# Patient Record
Sex: Female | Born: 1950 | Race: White | Hispanic: No | Marital: Married | Smoking: Never smoker
Health system: Southern US, Community
[De-identification: ages and names within clinical notes are randomized; demographics above are authoritative.]

## PROBLEM LIST (undated history)

## (undated) DIAGNOSIS — I10 Essential (primary) hypertension: Secondary | ICD-10-CM

## (undated) DIAGNOSIS — F419 Anxiety disorder, unspecified: Secondary | ICD-10-CM

## (undated) DIAGNOSIS — E7800 Pure hypercholesterolemia, unspecified: Secondary | ICD-10-CM

## (undated) DIAGNOSIS — I639 Cerebral infarction, unspecified: Secondary | ICD-10-CM

## (undated) DIAGNOSIS — K829 Disease of gallbladder, unspecified: Secondary | ICD-10-CM

## (undated) DIAGNOSIS — M1990 Unspecified osteoarthritis, unspecified site: Secondary | ICD-10-CM

## (undated) DIAGNOSIS — G4730 Sleep apnea, unspecified: Secondary | ICD-10-CM

## (undated) HISTORY — DX: Pure hypercholesterolemia, unspecified: E78.00

## (undated) HISTORY — DX: Cerebral infarction, unspecified: I63.9

## (undated) HISTORY — PX: OTHER SURGICAL HISTORY: SHX169

## (undated) HISTORY — DX: Essential (primary) hypertension: I10

## (undated) HISTORY — DX: Unspecified osteoarthritis, unspecified site: M19.90

## (undated) HISTORY — PX: APPENDECTOMY: SHX54

---

## 2000-01-12 ENCOUNTER — Other Ambulatory Visit: Admission: RE | Admit: 2000-01-12 | Discharge: 2000-01-12 | Payer: Self-pay | Admitting: Family Medicine

## 2003-07-23 ENCOUNTER — Ambulatory Visit (HOSPITAL_COMMUNITY): Admission: RE | Admit: 2003-07-23 | Discharge: 2003-07-23 | Payer: Self-pay | Admitting: *Deleted

## 2005-01-16 ENCOUNTER — Encounter: Admission: RE | Admit: 2005-01-16 | Discharge: 2005-01-16 | Payer: Self-pay | Admitting: Family Medicine

## 2005-07-31 ENCOUNTER — Encounter: Admission: RE | Admit: 2005-07-31 | Discharge: 2005-07-31 | Payer: Self-pay | Admitting: Family Medicine

## 2006-05-16 ENCOUNTER — Other Ambulatory Visit: Admission: RE | Admit: 2006-05-16 | Discharge: 2006-05-16 | Payer: Self-pay | Admitting: Family Medicine

## 2006-09-06 ENCOUNTER — Ambulatory Visit (HOSPITAL_COMMUNITY): Admission: RE | Admit: 2006-09-06 | Discharge: 2006-09-06 | Payer: Self-pay | Admitting: *Deleted

## 2007-08-20 ENCOUNTER — Ambulatory Visit (HOSPITAL_COMMUNITY): Admission: RE | Admit: 2007-08-20 | Discharge: 2007-08-20 | Payer: Self-pay | Admitting: Family Medicine

## 2009-05-27 ENCOUNTER — Ambulatory Visit (HOSPITAL_COMMUNITY): Admission: RE | Admit: 2009-05-27 | Discharge: 2009-05-27 | Payer: Self-pay | Admitting: Family Medicine

## 2011-02-15 ENCOUNTER — Ambulatory Visit (HOSPITAL_COMMUNITY)
Admission: RE | Admit: 2011-02-15 | Discharge: 2011-02-15 | Disposition: A | Discharge status: Home / Self Care | Payer: BC Managed Care – PPO | Source: Ambulatory Visit | Attending: Family Medicine | Admitting: Family Medicine

## 2011-02-15 ENCOUNTER — Other Ambulatory Visit (HOSPITAL_COMMUNITY): Payer: Self-pay | Admitting: Family Medicine

## 2011-02-15 DIAGNOSIS — Z01818 Encounter for other preprocedural examination: Secondary | ICD-10-CM | POA: Insufficient documentation

## 2011-02-15 DIAGNOSIS — I10 Essential (primary) hypertension: Secondary | ICD-10-CM | POA: Insufficient documentation

## 2011-02-15 DIAGNOSIS — E785 Hyperlipidemia, unspecified: Secondary | ICD-10-CM | POA: Insufficient documentation

## 2011-04-13 ENCOUNTER — Encounter (HOSPITAL_COMMUNITY)
Admission: RE | Admit: 2011-04-13 | Discharge: 2011-04-13 | Disposition: A | Discharge status: Home / Self Care | Payer: BC Managed Care – PPO | Source: Ambulatory Visit | Attending: Orthopedic Surgery | Admitting: Orthopedic Surgery

## 2011-04-13 LAB — DIFFERENTIAL
Basophils Relative: 1 % (ref 0–1)
Eosinophils Absolute: 0.1 10*3/uL (ref 0.0–0.7)
Eosinophils Relative: 2 % (ref 0–5)
Lymphs Abs: 1.6 10*3/uL (ref 0.7–4.0)
Monocytes Absolute: 0.6 10*3/uL (ref 0.1–1.0)
Monocytes Relative: 9 % (ref 3–12)
Neutrophils Relative %: 64 % (ref 43–77)

## 2011-04-13 LAB — CBC
MCH: 30.6 pg (ref 26.0–34.0)
MCHC: 34.9 g/dL (ref 30.0–36.0)
MCV: 87.5 fL (ref 78.0–100.0)
Platelets: 235 10*3/uL (ref 150–400)
RBC: 5.27 MIL/uL — ABNORMAL HIGH (ref 3.87–5.11)
RDW: 13 % (ref 11.5–15.5)

## 2011-04-13 LAB — COMPREHENSIVE METABOLIC PANEL
ALT: 38 U/L — ABNORMAL HIGH (ref 0–35)
AST: 28 U/L (ref 0–37)
Calcium: 9.4 mg/dL (ref 8.4–10.5)
GFR calc Af Amer: >60 mL/min (ref >60)
Glucose, Bld: 104 mg/dL — ABNORMAL HIGH (ref 70–99)
Sodium: 144 mEq/L (ref 135–145)
Total Protein: 7 g/dL (ref 6.0–8.3)

## 2011-04-13 LAB — PROTIME-INR: INR: 0.98 (ref 0.00–1.49)

## 2011-04-13 LAB — URINALYSIS, ROUTINE W REFLEX MICROSCOPIC
Bilirubin Urine: NEGATIVE
Ketones, ur: NEGATIVE mg/dL
Nitrite: NEGATIVE
Protein, ur: NEGATIVE mg/dL
Urobilinogen, UA: 0.2 mg/dL (ref 0.0–1.0)

## 2011-04-13 LAB — SURGICAL PCR SCREEN: Staphylococcus aureus: NEGATIVE

## 2011-04-14 LAB — URINE CULTURE: Culture  Setup Time: 201206011646

## 2011-04-20 ENCOUNTER — Inpatient Hospital Stay (HOSPITAL_COMMUNITY)
Admission: RE | Admit: 2011-04-20 | Discharge: 2011-04-23 | DRG: 209 | Disposition: A | Discharge status: Home Health | Payer: BC Managed Care – PPO | Source: Ambulatory Visit | Attending: Orthopedic Surgery | Admitting: Orthopedic Surgery

## 2011-04-20 ENCOUNTER — Inpatient Hospital Stay (HOSPITAL_COMMUNITY): Payer: BC Managed Care – PPO

## 2011-04-20 DIAGNOSIS — M1710 Unilateral primary osteoarthritis, unspecified knee: Principal | ICD-10-CM | POA: Diagnosis present

## 2011-04-20 DIAGNOSIS — Z01818 Encounter for other preprocedural examination: Secondary | ICD-10-CM

## 2011-04-20 DIAGNOSIS — E669 Obesity, unspecified: Secondary | ICD-10-CM | POA: Diagnosis present

## 2011-04-20 DIAGNOSIS — I10 Essential (primary) hypertension: Secondary | ICD-10-CM | POA: Diagnosis present

## 2011-04-20 DIAGNOSIS — E785 Hyperlipidemia, unspecified: Secondary | ICD-10-CM | POA: Diagnosis present

## 2011-04-20 LAB — ABO/RH: ABO/RH(D): O POS

## 2011-04-21 LAB — CBC
HCT: 36.4 % (ref 36.0–46.0)
Hemoglobin: 12.4 g/dL (ref 12.0–15.0)
MCHC: 34.1 g/dL (ref 30.0–36.0)
RBC: 4.12 MIL/uL (ref 3.87–5.11)

## 2011-04-22 LAB — CBC
HCT: 36.1 % (ref 36.0–46.0)
Hemoglobin: 12.6 g/dL (ref 12.0–15.0)
MCH: 30.7 pg (ref 26.0–34.0)
MCV: 87.8 fL (ref 78.0–100.0)
Platelets: 166 10*3/uL (ref 150–400)
RBC: 4.11 MIL/uL (ref 3.87–5.11)

## 2011-04-22 LAB — COMPREHENSIVE METABOLIC PANEL
BUN: 13 mg/dL (ref 6–23)
CO2: 28 mEq/L (ref 19–32)
Calcium: 8.7 mg/dL (ref 8.4–10.5)
Creatinine, Ser: 0.66 mg/dL (ref 0.4–1.2)
GFR calc Af Amer: >60 mL/min (ref >60)
GFR calc non Af Amer: >60 mL/min (ref >60)
Glucose, Bld: 142 mg/dL — ABNORMAL HIGH (ref 70–99)

## 2011-04-23 LAB — CBC
Hemoglobin: 12.6 g/dL (ref 12.0–15.0)
Platelets: 187 10*3/uL (ref 150–400)
RBC: 4.22 MIL/uL (ref 3.87–5.11)

## 2011-04-23 LAB — COMPREHENSIVE METABOLIC PANEL
ALT: 23 U/L (ref 0–35)
AST: 21 U/L (ref 0–37)
Alkaline Phosphatase: 43 U/L (ref 39–117)
CO2: 30 mEq/L (ref 19–32)
GFR calc Af Amer: >60 mL/min (ref >60)
GFR calc non Af Amer: >60 mL/min (ref >60)
Glucose, Bld: 121 mg/dL — ABNORMAL HIGH (ref 70–99)
Potassium: 3.5 mEq/L (ref 3.5–5.1)
Sodium: 139 mEq/L (ref 135–145)
Total Protein: 6.3 g/dL (ref 6.0–8.3)

## 2011-04-30 NOTE — Op Note (Signed)
  NAME:  Heidi Rhodes, Heidi Rhodes NO.:  [id number]  MEDICAL RECORD NO.:  [id number]  LOCATION:  5037                         FACILITY:  [facility]  PHYSICIAN:  Dyke Brackett, M.D.    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  PREOPERATIVE DIAGNOSIS:  Osteoarthritis, left knee.  POSTOPERATIVE DIAGNOSIS:  Osteoarthritis, left knee.  OPERATION:  Sigma rotating bearing cemented knee (size 4 femur, tibia 12.5-mm, 3 peg all-poly patella with revision tray).  SURGEON:  Dyke Brackett, MD  ASSISTANT:  Oris Drone. Petrarca, PA-C  TOURNIQUET TIME:  1 hour 7 minutes.  PROCEDURE IN DETAILS:  Straight skin incision was made.  Medial parapatellar approach to the knee made.  We did moderate stripping on the medial side of the knee for a varus knee, removed osteophytes, resected ACL, PCL, cut the distal femur 5 degree valgus, cut at the left knee, followed by a neutral cut on the tibia, resecting about 2 mm below the most diseased medial compartment.  We then checked the extension gap which was 12.5 mm eventually matching the flexion gap at 12.5.  We sized the femur to be a 4, followed by placement of the alignment jig to set rotation with pins.  Then did the anterior, posterior and chamfer cuts. Again checked the flexion gap at 12-5.  Attention was next directed at the tibia.  Excess menisci were removed. Complete release of the PCL, sized the tibia to be 4 and then placed the reamer and then drilled for revision tray in view of the patient's large BMI.  Trial was placed on the tibia and the femur and we cut the patella after measuring the patella leaving about 17 mm of native patella for a 38-mm patella.  Trial reduction was carried out with full  extension.  No significant instability of varus valgus stress testing and good stability of the bearing.  Final components were inserted to tibia followed by femur patella.  We placed a trial tibial bearing of the cement hardened.  Excess cement was removed.  We removed the trial bearing, checked the back of the knee for excess cement, none was noted.  We then released the tourniquet.  No excessive bleeding was noted from the back of the knee.  We placed the final bearing and the knee and again checked all parameters which were deemed to be acceptable.  Closure was affected with #1 Ethibond, 2-0 Vicryl, and skin clips.  Capsule was infiltrated with Marcaine.  Hemovac drain placed exiting superolaterally.  Lightly compressive sterile dressing, knee immobilizer applied.  Taken to recovery room in stable condition.     Dyke Brackett, M.D.     WDC/MEDQ  D:  04/20/2011  T:  04/21/2011  Job:  [id number]  Electronically Signed by W. Ancelmo Hunt M.D. on 04/30/2011 01:06:51 PM

## 2011-12-04 ENCOUNTER — Other Ambulatory Visit (HOSPITAL_COMMUNITY): Payer: Self-pay | Admitting: Internal Medicine

## 2011-12-04 ENCOUNTER — Other Ambulatory Visit (HOSPITAL_COMMUNITY): Payer: Self-pay | Admitting: Family Medicine

## 2011-12-04 DIAGNOSIS — K589 Irritable bowel syndrome without diarrhea: Secondary | ICD-10-CM

## 2011-12-04 DIAGNOSIS — Z139 Encounter for screening, unspecified: Secondary | ICD-10-CM

## 2011-12-04 DIAGNOSIS — R109 Unspecified abdominal pain: Secondary | ICD-10-CM

## 2011-12-05 ENCOUNTER — Encounter (INDEPENDENT_AMBULATORY_CARE_PROVIDER_SITE_OTHER): Payer: Self-pay | Admitting: *Deleted

## 2011-12-11 ENCOUNTER — Ambulatory Visit (HOSPITAL_COMMUNITY)
Admission: RE | Admit: 2011-12-11 | Discharge: 2011-12-11 | Disposition: A | Discharge status: Home / Self Care | Payer: BC Managed Care – PPO | Source: Ambulatory Visit | Attending: Family Medicine | Admitting: Family Medicine

## 2011-12-11 ENCOUNTER — Ambulatory Visit (HOSPITAL_COMMUNITY)
Admission: RE | Admit: 2011-12-11 | Discharge: 2011-12-11 | Disposition: A | Discharge status: Home / Self Care | Payer: BC Managed Care – PPO | Source: Ambulatory Visit | Attending: Internal Medicine | Admitting: Internal Medicine

## 2011-12-11 ENCOUNTER — Other Ambulatory Visit (HOSPITAL_COMMUNITY): Payer: Self-pay | Admitting: Family Medicine

## 2011-12-11 DIAGNOSIS — K589 Irritable bowel syndrome without diarrhea: Secondary | ICD-10-CM

## 2011-12-11 DIAGNOSIS — R109 Unspecified abdominal pain: Secondary | ICD-10-CM

## 2011-12-11 DIAGNOSIS — Z139 Encounter for screening, unspecified: Secondary | ICD-10-CM

## 2011-12-11 DIAGNOSIS — K8020 Calculus of gallbladder without cholecystitis without obstruction: Secondary | ICD-10-CM | POA: Insufficient documentation

## 2011-12-11 DIAGNOSIS — Z1231 Encounter for screening mammogram for malignant neoplasm of breast: Secondary | ICD-10-CM | POA: Insufficient documentation

## 2011-12-18 ENCOUNTER — Encounter (INDEPENDENT_AMBULATORY_CARE_PROVIDER_SITE_OTHER): Payer: Self-pay | Admitting: Internal Medicine

## 2011-12-18 ENCOUNTER — Telehealth (INDEPENDENT_AMBULATORY_CARE_PROVIDER_SITE_OTHER): Payer: Self-pay | Admitting: *Deleted

## 2011-12-18 ENCOUNTER — Other Ambulatory Visit (INDEPENDENT_AMBULATORY_CARE_PROVIDER_SITE_OTHER): Payer: Self-pay | Admitting: *Deleted

## 2011-12-18 ENCOUNTER — Ambulatory Visit (INDEPENDENT_AMBULATORY_CARE_PROVIDER_SITE_OTHER): Payer: BC Managed Care – PPO | Admitting: Internal Medicine

## 2011-12-18 ENCOUNTER — Encounter (INDEPENDENT_AMBULATORY_CARE_PROVIDER_SITE_OTHER): Payer: Self-pay | Admitting: *Deleted

## 2011-12-18 VITALS — BP 128/82 | HR 68 | Temp 98.9°F | Ht 66.0 in | Wt 257.8 lb

## 2011-12-18 DIAGNOSIS — R109 Unspecified abdominal pain: Secondary | ICD-10-CM | POA: Insufficient documentation

## 2011-12-18 DIAGNOSIS — I10 Essential (primary) hypertension: Secondary | ICD-10-CM | POA: Insufficient documentation

## 2011-12-18 DIAGNOSIS — Z1211 Encounter for screening for malignant neoplasm of colon: Secondary | ICD-10-CM

## 2011-12-18 DIAGNOSIS — R1030 Lower abdominal pain, unspecified: Secondary | ICD-10-CM

## 2011-12-18 DIAGNOSIS — M1990 Unspecified osteoarthritis, unspecified site: Secondary | ICD-10-CM | POA: Insufficient documentation

## 2011-12-18 DIAGNOSIS — M129 Arthropathy, unspecified: Secondary | ICD-10-CM

## 2011-12-18 MED ORDER — HYOSCYAMINE SULFATE 0.125 MG SL SUBL: 0.1250 mg | SUBLINGUAL_TABLET | SUBLINGUAL | Status: DC | PRN

## 2011-12-18 MED ORDER — CIPROFLOXACIN IN D5W 400 MG/200ML IV SOLN: 400.0000 mg | Freq: Once | INTRAVENOUS | Status: DC

## 2011-12-18 NOTE — Telephone Encounter (Signed)
Patient needs half lytely 

## 2011-12-18 NOTE — Patient Instructions (Signed)
Levsin 0.125mg  sl as needed for abdominal pain. HIDA scan

## 2011-12-18 NOTE — Progress Notes (Signed)
Subjective:     Patient ID: Heidi Rhodes, female   DOB: June 06, 1951, 61 y.o.   MRN: [id number]  HPI  Heidi Rhodes is a 61 yr old female referred to our by Dr. Phillips Odor at [facility] for abdominal.  She tells me she has to be careful what she eats. She avoids raw vegetables.  If she does eat them, she will have diarrhea.  She says that she drank a half cup of coffee and then had urgency. Her symptoms have been going on for years. She says her symptoms have gradually gotten worse. Having a BM relieves her symptoms.  She is having some acid reflux.   She says she has lower abdominal pain after eating which she describes as cramping.  She takes Dicyclomine about 30 minutes before she eats.  She says this has helped some.  Appetite is good. No weight loss.  BM x 1-2 a day.  Stools are usually formed. No melena or bright red rectal bleeding.   Last colonoscopy was in 2003 thru [facility].and was normal.  12/04/2011 Abdominal US: IMPRESSION: Cholelithiasis.No ultrasonic evidence of acute  cholecystitis. Liver upper normal size with increased echogenicity  of the hepatic parenchyma. Most commonly this is associated with  fatty infiltration of the liver but is not specific for it.  Original Report Authenticated By: Crawford Givens, M.D.    Review of Systems Current Outpatient Prescriptions  Medication Sig Dispense Refill  . amLODipine (NORVASC) 5 MG tablet Take 5 mg by mouth daily.      . Ascorbic Acid (VITAMIN C) 100 MG tablet Take 100 mg by mouth daily.      Marland Kitchen dicyclomine (BENTYL) 10 MG capsule Take 10 mg by mouth as needed.      . hydrochlorothiazide (HYDRODIURIL) 25 MG tablet Take 25 mg by mouth daily.      . Multiple Vitamin (MULITIVITAMIN) LIQD Take 5 mLs by mouth daily.       Past Medical History  Diagnosis Date  . HTN (hypertension)   . Arthritis    Past Surgical History  Procedure Date  . Left knee replacement     2012   Family Status  Relation Status Death Age  . Mother Alive     good  health  . Father Deceased     MI age 79 or 29  . Brother Other     One deceased from [facility], one as arthritis. One in good health.   History   Social History  . Marital Status: Married    Spouse Name: N/A    Number of Children: N/A  . Years of Education: N/A   Occupational History  . Not on file.   Social History Main Topics  . Smoking status: Never Smoker   . Smokeless tobacco: Not on file  . Alcohol Use: No  . Drug Use: No  . Sexually Active: Not on file   Other Topics Concern  . Not on file   Social History Narrative  . No narrative on file   Allergies no known allergies     Objective:   Physical Exam  Filed Vitals:   12/18/11 0946  Height: 5\' 6"  (1.676 m)  Weight: 257 lb 12.8 oz (116.937 kg)    Alert and oriented. Skin warm and dry. Oral mucosa is moist.   . Sclera anicteric, conjunctivae is pink. Thyroid not enlarged. No cervical lymphadenopathy. Lungs clear. Heart regular rate and rhythm.  Abdomen is soft. Bowel sounds are positive. No hepatomegaly. No  abdominal masses felt. No tenderness.  No edema to lower extremities. Patient is alert and oriented.      Assessment:   Lower abdominal pain which appears to Irritable bowel syndrome. Symptoms for years.  Her last colonoscopy was about 10 yrs ago.      Plan:   Colonoscopy in the near future with Dr. Karilyn Cota

## 2011-12-21 MED ORDER — BISACODYL-PEG-KCL-NABICAR-NACL 5-210 MG-GM PO KIT: 1.0000 | PACK | Freq: Once | ORAL | Status: DC

## 2012-02-11 ENCOUNTER — Encounter (HOSPITAL_COMMUNITY): Payer: Self-pay | Admitting: Pharmacy Technician

## 2012-02-13 ENCOUNTER — Encounter (HOSPITAL_COMMUNITY)
Admission: RE | Disposition: A | Discharge status: Home / Self Care | Payer: Self-pay | Source: Ambulatory Visit | Attending: Internal Medicine

## 2012-02-13 ENCOUNTER — Encounter (HOSPITAL_COMMUNITY): Payer: Self-pay | Admitting: *Deleted

## 2012-02-13 ENCOUNTER — Ambulatory Visit (HOSPITAL_COMMUNITY)
Admission: RE | Admit: 2012-02-13 | Discharge: 2012-02-13 | Disposition: A | Discharge status: Home / Self Care | Payer: BC Managed Care – PPO | Source: Ambulatory Visit | Attending: Internal Medicine | Admitting: Internal Medicine

## 2012-02-13 DIAGNOSIS — Z96659 Presence of unspecified artificial knee joint: Secondary | ICD-10-CM | POA: Insufficient documentation

## 2012-02-13 DIAGNOSIS — K5730 Diverticulosis of large intestine without perforation or abscess without bleeding: Secondary | ICD-10-CM

## 2012-02-13 DIAGNOSIS — Z1211 Encounter for screening for malignant neoplasm of colon: Secondary | ICD-10-CM

## 2012-02-13 DIAGNOSIS — I10 Essential (primary) hypertension: Secondary | ICD-10-CM | POA: Insufficient documentation

## 2012-02-13 DIAGNOSIS — K644 Residual hemorrhoidal skin tags: Secondary | ICD-10-CM | POA: Insufficient documentation

## 2012-02-13 DIAGNOSIS — R1030 Lower abdominal pain, unspecified: Secondary | ICD-10-CM

## 2012-02-13 DIAGNOSIS — R109 Unspecified abdominal pain: Secondary | ICD-10-CM | POA: Insufficient documentation

## 2012-02-13 DIAGNOSIS — M129 Arthropathy, unspecified: Secondary | ICD-10-CM | POA: Insufficient documentation

## 2012-02-13 DIAGNOSIS — Z79899 Other long term (current) drug therapy: Secondary | ICD-10-CM | POA: Insufficient documentation

## 2012-02-13 HISTORY — PX: COLONOSCOPY: SHX5424

## 2012-02-13 SURGERY — COLONOSCOPY
Anesthesia: Moderate Sedation

## 2012-02-13 MED ORDER — MIDAZOLAM HCL 5 MG/5ML IJ SOLN
INTRAMUSCULAR | Status: DC | PRN
  Administered 2012-02-13 (×3): 2 mg via INTRAVENOUS

## 2012-02-13 MED ORDER — STERILE WATER FOR IRRIGATION IR SOLN
Status: DC | PRN
  Administered 2012-02-13: 10:00:00

## 2012-02-13 MED ORDER — SODIUM CHLORIDE 0.9 % IV SOLN
2.0000 g | Freq: Once | INTRAVENOUS | Status: AC
  Administered 2012-02-13: 2 g via INTRAVENOUS

## 2012-02-13 MED ORDER — MIDAZOLAM HCL 5 MG/5ML IJ SOLN
INTRAMUSCULAR | Status: AC
  Filled 2012-02-13: qty 10

## 2012-02-13 MED ORDER — CIPROFLOXACIN IN D5W 400 MG/200ML IV SOLN
INTRAVENOUS | Status: AC
  Filled 2012-02-13: qty 200

## 2012-02-13 MED ORDER — SODIUM CHLORIDE 0.45 % IV SOLN
Freq: Once | INTRAVENOUS | Status: AC
  Administered 2012-02-13: 1000 mL via INTRAVENOUS

## 2012-02-13 MED ORDER — MEPERIDINE HCL 50 MG/ML IJ SOLN
INTRAMUSCULAR | Status: DC | PRN
  Administered 2012-02-13 (×2): 25 mg via INTRAVENOUS

## 2012-02-13 MED ORDER — MEPERIDINE HCL 50 MG/ML IJ SOLN
INTRAMUSCULAR | Status: AC
  Filled 2012-02-13: qty 1

## 2012-02-13 MED ORDER — SODIUM CHLORIDE 0.9 % IV SOLN
INTRAVENOUS | Status: AC
  Filled 2012-02-13: qty 2000

## 2012-02-13 MED ORDER — CIPROFLOXACIN IN D5W 400 MG/200ML IV SOLN
400.0000 mg | Freq: Once | INTRAVENOUS | Status: AC
  Administered 2012-02-13: 400 mg via INTRAVENOUS

## 2012-02-13 NOTE — H&P (Signed)
Heidi Rhodes is an 61 y.o. female.   Chief Complaint: Patient is for colonoscopy. HPI: Patient is 61 year old Caucasian female who is her average risk screening colonoscopy. Her last exam was 10 years ago in [name], [place] and was normal. She has intermittent postprandial abdominal cramps and diarrhea and has responded to Levsin that she uses on an as-needed basis. She denies rectal bleeding. Family history is negative for colorectal carcinoma. Patient has been given ampicillin and Cipro IV for antimicrobial prophylaxis since she had left knee replacement last year.  Past Medical History  Diagnosis Date  . HTN (hypertension)   . Arthritis     Past Surgical History  Procedure Date  . Left knee replacement     2012  . Appendectomy   . Ceasarean section     Family History  Problem Relation Age of Onset  . Heart attack Father    Social History:  reports that she has never smoked. She does not have any smokeless tobacco history on file. She reports that she does not drink alcohol or use illicit drugs.  Allergies: No Known Allergies  Medications Prior to Admission  Medication Dose Route Frequency Provider Last Rate Last Dose  . 0.45 % sodium chloride infusion   Intravenous Once Malissa Hippo, MD 20 mL/hr at 02/13/12 0838 1,000 mL at 02/13/12 0838  . ampicillin (OMNIPEN) 2 g in sodium chloride 0.9 % 50 mL IVPB  2 g Intravenous Once Malissa Hippo, MD   2 g at 02/13/12 0839  . ciprofloxacin (CIPRO) 400 MG/200ML IVPB           . ciprofloxacin (CIPRO) IVPB 400 mg  400 mg Intravenous Once Malissa Hippo, MD   400 mg at 02/13/12 0849  . meperidine (DEMEROL) 50 MG/ML injection           . midazolam (VERSED) 5 MG/5ML injection           . simethicone susp in sterile water 1000 mL irrigation    PRN Malissa Hippo, MD      . sodium chloride 0.9 % with ampicillin (OMNIPEN) ADS Med            Medications Prior to Admission  Medication Sig Dispense Refill  .  Bisacodyl-PEG-KCl-NaBicar-NaCl (HALFLYTELY WITH FLAVOR PACKS) 5-210 MG-GM kit Take 1 Package by mouth once.  1 each  0    No results found for this or any previous visit (from the past 48 hour(s)). No results found.  Review of Systems  Constitutional: Negative for weight loss.  Gastrointestinal: Negative for constipation, blood in stool and melena.    Blood pressure 149/73, pulse 82, temperature 97.9 F (36.6 C), temperature source Oral, resp. rate 18, height 5\' 7"  (1.702 m), weight 267 lb (121.11 kg), SpO2 98.00%. Physical Exam  Constitutional: She appears well-developed and well-nourished.  HENT:  Mouth/Throat: Oropharynx is clear and moist.  Eyes: Conjunctivae are normal. No scleral icterus.  Neck: No thyromegaly present.  Cardiovascular: Normal rate, regular rhythm and normal heart sounds.   No murmur heard. Respiratory: Effort normal and breath sounds normal.  GI: Soft. She exhibits no distension. There is no tenderness.  Musculoskeletal: She exhibits no edema.  Lymphadenopathy:    She has no cervical adenopathy.  Neurological: She is alert.  Skin: Skin is warm.     Assessment/Plan Average risk screening colonoscopy.  Kayliegh Boyers U 02/13/2012, 9:49 AM

## 2012-02-13 NOTE — Op Note (Signed)
COLONOSCOPY PROCEDURE REPORT  PATIENT:  Heidi Rhodes  MR#:  [id number] Birthdate:  03-21-1951, 61 y.o., female Endoscopist:  Dr. Malissa Hippo, MD Referred By:  Dr. Colette Ribas, MD Procedure Date: 02/13/2012  Procedure:   Colonoscopy  Indications:  Patient is 61 year old Caucasian female who is undergoing average risk screening colonoscopy.  Informed Consent:  The procedure and risks were reviewed with the patient and informed consent was obtained.  Medications:  Demerol 50 mg IV Versed 6 mg IV  Description of procedure:  After a digital rectal exam was performed, that colonoscope was advanced from the anus through the rectum and colon to the area of the cecum, ileocecal valve and appendiceal orifice. The cecum was deeply intubated. These structures were well-seen and photographed for the record. From the level of the cecum and ileocecal valve, the scope was slowly and cautiously withdrawn. The mucosal surfaces were carefully surveyed utilizing scope tip to flexion to facilitate fold flattening as needed. The scope was pulled down into the rectum where a thorough exam including retroflexion was performed.  Findings:   Prep at right colon was marginal and satisfactory distal to the hepatic flexure. Cecal landmarks well as seen after vigorous washing. Normal mucosa throughout. Few diverticula at the sigmoid colon. Small hemorrhoids below the dentate line and anal papillae.  Therapeutic/Diagnostic Maneuvers Performed:  None  Complications:  None  Cecal Withdrawal Time:  12 minutes  Impression:  Examination performed to cecum. Mild sigmoid colon diverticulosis. Small external hemorrhoids and anal papillae.  Recommendations:  Standard instructions given. Next screening exam in 10 years.  Heidi Rhodes U  02/13/2012 10:23 AM  CC: Dr. Colette Ribas, MD, MD & Dr. Bonnetta Barry ref. provider found

## 2012-02-13 NOTE — Discharge Instructions (Signed)
Resume usual medications and high fiber diet. No driving for 16-XWRUE. Next screening exam in 10 years. Do not take dicyclomine while on hyoscyamine or Levsin SL. Colonoscopy Care After Read the instructions outlined below and refer to this sheet in the next few weeks. These discharge instructions provide you with general information on caring for yourself after you leave the hospital. Your doctor may also give you specific instructions. While your treatment has been planned according to the most current medical practices available, unavoidable complications occasionally occur. If you have any problems or questions after discharge, call your doctor. HOME CARE INSTRUCTIONS ACTIVITY:  You may resume your regular activity, but move at a slower pace for the next 24 hours.   Take frequent rest periods for the next 24 hours.   Walking will help get rid of the air and reduce the bloated feeling in your belly (abdomen).   No driving for 24 hours (because of the medicine (anesthesia) used during the test).   You may shower.   Do not sign any important legal documents or operate any machinery for 24 hours (because of the anesthesia used during the test).  NUTRITION:  Drink plenty of fluids.   You may resume your normal diet as instructed by your doctor.   Begin with a light meal and progress to your normal diet. Heavy or fried foods are harder to digest and may make you feel sick to your stomach (nauseated).   Avoid alcoholic beverages for 24 hours or as instructed.  MEDICATIONS:  You may resume your normal medications unless your doctor tells you otherwise.  WHAT TO EXPECT TODAY:  Some feelings of bloating in the abdomen.   Passage of more gas than usual.   Spotting of blood in your stool or on the toilet paper.  IF YOU HAD POLYPS REMOVED DURING THE COLONOSCOPY:  No aspirin products for 7 days or as instructed.   No alcohol for 7 days or as instructed.   Eat a soft diet for the  next 24 hours.  FINDING OUT THE RESULTS OF YOUR TEST Not all test results are available during your visit. If your test results are not back during the visit, make an appointment with your caregiver to find out the results. Do not assume everything is normal if you have not heard from your caregiver or the medical facility. It is important for you to follow up on all of your test results.  SEEK IMMEDIATE MEDICAL CARE IF:  You have more than a spotting of blood in your stool.   Your belly is swollen (abdominal distention).   You are nauseated or vomiting.   You have a fever.   You have abdominal pain or discomfort that is severe or gets worse throughout the day.  Document Released: 06/12/2004 Document Revised: 10/18/2011 Document Reviewed: 06/10/2008 [facility] Patient Information 2012 [place], [place].Hemorrhoids Hemorrhoids are enlarged (dilated) veins around the rectum. There are 2 types of hemorrhoids, and the type of hemorrhoid is determined by its location. Internal hemorrhoids occur in the veins just inside the rectum.They are usually not painful, but they may bleed.However, they may poke through to the outside and become irritated and painful. External hemorrhoids involve the veins outside the anus and can be felt as a painful swelling or hard lump near the anus.They are often itchy and may crack and bleed. Sometimes clots will form in the veins. This makes them swollen and painful. These are called thrombosed hemorrhoids. CAUSES Causes of hemorrhoids include:  Pregnancy.  This increases the pressure in the hemorrhoidal veins.   Constipation.   Straining to have a bowel movement.   Obesity.   Heavy lifting or other activity that caused you to strain.  TREATMENT Most of the time hemorrhoids improve in 1 to 2 weeks. However, if symptoms do not seem to be getting better or if you have a lot of rectal bleeding, your caregiver may perform a procedure to help make the hemorrhoids get  smaller or remove them completely.Possible treatments include:  Rubber band ligation. A rubber band is placed at the base of the hemorrhoid to cut off the circulation.   Sclerotherapy. A chemical is injected to shrink the hemorrhoid.   Infrared light therapy. Tools are used to burn the hemorrhoid.   Hemorrhoidectomy. This is surgical removal of the hemorrhoid.  HOME CARE INSTRUCTIONS   Increase fiber in your diet. Ask your caregiver about using fiber supplements.   Drink enough water and fluids to keep your urine clear or pale yellow.   Exercise regularly.   Go to the bathroom when you have the urge to have a bowel movement. Do not wait.   Avoid straining to have bowel movements.   Keep the anal area dry and clean.   Only take over-the-counter or prescription medicines for pain, discomfort, or fever as directed by your caregiver.  If your hemorrhoids are thrombosed:  Take warm sitz baths for 20 to 30 minutes, 3 to 4 times per day.   If the hemorrhoids are very tender and swollen, place ice packs on the area as tolerated. Using ice packs between sitz baths may be helpful. Fill a plastic bag with ice. Place a towel between the bag of ice and your skin.   Medicated creams and suppositories may be used or applied as directed.   Do not use a donut-shaped pillow or sit on the toilet for long periods. This increases blood pooling and pain.  SEEK MEDICAL CARE IF:   You have increasing pain and swelling that is not controlled with your medicine.   You have uncontrolled bleeding.   You have difficulty or you are unable to have a bowel movement.   You have pain or inflammation outside the area of the hemorrhoids.   You have chills or an oral temperature above 102 F (38.9 C).  MAKE SURE YOU:   Understand these instructions.   Will watch your condition.   Will get help right away if you are not doing well or get worse.  Document Released: 10/26/2000 Document Revised:  10/18/2011 Document Reviewed: 03/02/2008 [facility] Patient Information 2012 [place], [place].Diverticulosis Diverticulosis is a common condition that develops when small pouches (diverticula) form in the wall of the colon. The risk of diverticulosis increases with age. It happens more often in people who eat a low-fiber diet. Most individuals with diverticulosis have no symptoms. Those individuals with symptoms usually experience abdominal pain, constipation, or loose stools (diarrhea). HOME CARE INSTRUCTIONS   Increase the amount of fiber in your diet as directed by your caregiver or dietician. This may reduce symptoms of diverticulosis.   Your caregiver may recommend taking a dietary fiber supplement.   Drink at least 6 to 8 glasses of water each day to prevent constipation.   Try not to strain when you have a bowel movement.   Your caregiver may recommend avoiding nuts and seeds to prevent complications, although this is still an uncertain benefit.   Only take over-the-counter or prescription medicines for pain, discomfort, or fever  as directed by your caregiver.  FOODS WITH HIGH FIBER CONTENT INCLUDE:  Fruits. Apple, peach, pear, tangerine, raisins, prunes.   Vegetables. Brussels sprouts, asparagus, broccoli, cabbage, carrot, cauliflower, romaine lettuce, spinach, summer squash, tomato, winter squash, zucchini.   Starchy Vegetables. Baked beans, kidney beans, lima beans, split peas, lentils, potatoes (with skin).   Grains. Whole wheat bread, brown rice, bran flake cereal, plain oatmeal, white rice, shredded wheat, bran muffins.  SEEK IMMEDIATE MEDICAL CARE IF:   You develop increasing pain or severe bloating.   You have an oral temperature above 102 F (38.9 C), not controlled by medicine.   You develop vomiting or bowel movements that are bloody or black.  Document Released: 07/26/2004 Document Revised: 10/18/2011 Document Reviewed: 03/29/2010 [facility] Patient Information 2012  [place], [place].

## 2012-02-15 ENCOUNTER — Encounter (HOSPITAL_COMMUNITY): Payer: Self-pay

## 2012-02-15 ENCOUNTER — Encounter (HOSPITAL_COMMUNITY)
Admission: RE | Admit: 2012-02-15 | Discharge: 2012-02-15 | Disposition: A | Discharge status: Home / Self Care | Payer: BC Managed Care – PPO | Source: Ambulatory Visit | Attending: Internal Medicine | Admitting: Internal Medicine

## 2012-02-15 ENCOUNTER — Other Ambulatory Visit (INDEPENDENT_AMBULATORY_CARE_PROVIDER_SITE_OTHER): Payer: Self-pay | Admitting: Internal Medicine

## 2012-02-15 DIAGNOSIS — R109 Unspecified abdominal pain: Secondary | ICD-10-CM | POA: Insufficient documentation

## 2012-02-15 DIAGNOSIS — R932 Abnormal findings on diagnostic imaging of liver and biliary tract: Secondary | ICD-10-CM | POA: Insufficient documentation

## 2012-02-15 DIAGNOSIS — M1990 Unspecified osteoarthritis, unspecified site: Secondary | ICD-10-CM

## 2012-02-15 MED ORDER — MORPHINE SULFATE 2 MG/ML IJ SOLN: 2.0000 mg | Freq: Once | INTRAMUSCULAR | Status: DC

## 2012-02-15 MED ORDER — TECHNETIUM TC 99M MEBROFENIN IV KIT
5.0000 | PACK | Freq: Once | INTRAVENOUS | Status: AC | PRN
  Administered 2012-02-15: 5.5 via INTRAVENOUS

## 2012-02-15 MED ORDER — MORPHINE SULFATE 2 MG/ML IJ SOLN
INTRAMUSCULAR | Status: AC
  Filled 2012-02-15: qty 1

## 2012-02-15 NOTE — Progress Notes (Signed)
Morphine 2mg /mL     1mL injected per MD order thru right arm hep lock followed by 10mL 0.9% NS    Tolerated well

## 2012-02-20 ENCOUNTER — Telehealth (INDEPENDENT_AMBULATORY_CARE_PROVIDER_SITE_OTHER): Payer: Self-pay | Admitting: Internal Medicine

## 2012-02-20 NOTE — Telephone Encounter (Signed)
Spoke with patient concerning her abnormal HIDA scan.  Needs a surgical referral to Dr. Malvin Johns.   Ann, Surgical referral to Dr. Malvin Johns for abnormal HIDA scan.

## 2012-02-21 NOTE — Telephone Encounter (Signed)
appt w/ Dr Malvin Johns 02/27/12 @ 11:00, lmom advising patient of appt, notes faxed

## 2012-03-11 ENCOUNTER — Encounter (INDEPENDENT_AMBULATORY_CARE_PROVIDER_SITE_OTHER): Payer: Self-pay

## 2012-03-21 ENCOUNTER — Other Ambulatory Visit (INDEPENDENT_AMBULATORY_CARE_PROVIDER_SITE_OTHER): Payer: Self-pay | Admitting: Internal Medicine

## 2012-03-31 ENCOUNTER — Other Ambulatory Visit: Payer: Self-pay | Admitting: Physician Assistant

## 2012-04-01 ENCOUNTER — Encounter (HOSPITAL_COMMUNITY): Payer: Self-pay

## 2012-04-01 ENCOUNTER — Inpatient Hospital Stay (HOSPITAL_COMMUNITY): Admission: RE | Admit: 2012-04-01 | Discharge: 2012-04-01 | Payer: BC Managed Care – PPO | Source: Ambulatory Visit

## 2012-04-01 HISTORY — DX: Disease of gallbladder, unspecified: K82.9

## 2012-04-01 NOTE — Pre-Procedure Instructions (Signed)
[address] [place]  04/01/2012   Your procedure is scheduled on:  Friday, June 7  Report to [facility] at 9:00 AM.  Call this number if you have problems the morning of surgery: [phone]   Remember:   Do not eat food:After Midnight.  May have clear liquids: up to 4 Hours before arrival.  Clear liquids include soda, tea, black coffee, apple or grape juice, broth.  Take these medicines the morning of surgery with A SIP OF WATER: Amlodipine   Do not wear jewelry, make-up or nail polish.  Do not wear lotions, powders, or perfumes. You may wear deodorant.  Do not shave 48 hours prior to surgery. Men may shave face and neck.  Do not bring valuables to the hospital.  Contacts, dentures or bridgework may not be worn into surgery.  Leave suitcase in the car. After surgery it may be brought to your room.  For patients admitted to the hospital, checkout time is 11:00 AM the day of discharge.   Patients discharged the day of surgery will not be allowed to drive home.  Name and phone number of your driver: NA  Special Instructions: Incentive Spirometry - Practice and bring it with you on the day of surgery. and CHG Shower Use Special Wash: 1/2 bottle night before surgery and 1/2 bottle morning of surgery.   Please read over the following fact sheets that you were given: Pain Booklet, Coughing and Deep Breathing, Blood Transfusion Information, Total Joint Packet and Surgical Site Infection Prevention

## 2012-04-04 ENCOUNTER — Encounter (HOSPITAL_COMMUNITY): Payer: Self-pay | Admitting: Pharmacy Technician

## 2012-04-11 ENCOUNTER — Inpatient Hospital Stay (HOSPITAL_COMMUNITY): Admission: RE | Admit: 2012-04-11 | Payer: BC Managed Care – PPO | Source: Ambulatory Visit

## 2012-04-11 ENCOUNTER — Ambulatory Visit (HOSPITAL_COMMUNITY)
Admission: RE | Admit: 2012-04-11 | Discharge: 2012-04-11 | Disposition: A | Discharge status: Home / Self Care | Payer: BC Managed Care – PPO | Source: Ambulatory Visit | Attending: Orthopedic Surgery | Admitting: Orthopedic Surgery

## 2012-04-11 ENCOUNTER — Encounter (HOSPITAL_COMMUNITY)
Admission: RE | Admit: 2012-04-11 | Discharge: 2012-04-11 | Disposition: A | Discharge status: Home / Self Care | Payer: BC Managed Care – PPO | Source: Ambulatory Visit | Attending: Orthopedic Surgery | Admitting: Orthopedic Surgery

## 2012-04-11 ENCOUNTER — Other Ambulatory Visit (HOSPITAL_COMMUNITY): Payer: BC Managed Care – PPO

## 2012-04-11 ENCOUNTER — Encounter (HOSPITAL_COMMUNITY): Payer: Self-pay

## 2012-04-11 DIAGNOSIS — Z01818 Encounter for other preprocedural examination: Secondary | ICD-10-CM | POA: Insufficient documentation

## 2012-04-11 HISTORY — DX: Anxiety disorder, unspecified: F41.9

## 2012-04-11 LAB — URINALYSIS, ROUTINE W REFLEX MICROSCOPIC
Hgb urine dipstick: NEGATIVE
Ketones, ur: NEGATIVE mg/dL
Protein, ur: NEGATIVE mg/dL
Urobilinogen, UA: 0.2 mg/dL (ref 0.0–1.0)

## 2012-04-11 LAB — DIFFERENTIAL
Basophils Absolute: 0 10*3/uL (ref 0.0–0.1)
Lymphocytes Relative: 22 % (ref 12–46)
Monocytes Absolute: 0.7 10*3/uL (ref 0.1–1.0)
Neutro Abs: 4.7 10*3/uL (ref 1.7–7.7)
Neutrophils Relative %: 66 % (ref 43–77)

## 2012-04-11 LAB — CBC
MCH: 29.6 pg (ref 26.0–34.0)
MCV: 89 fL (ref 78.0–100.0)
Platelets: 216 10*3/uL (ref 150–400)
RDW: 13.4 % (ref 11.5–15.5)

## 2012-04-11 LAB — PROTIME-INR: INR: 0.99 (ref 0.00–1.49)

## 2012-04-11 LAB — COMPREHENSIVE METABOLIC PANEL
AST: 38 U/L — ABNORMAL HIGH (ref 0–37)
Albumin: 4.1 g/dL (ref 3.5–5.2)
CO2: 27 mEq/L (ref 19–32)
Calcium: 9.7 mg/dL (ref 8.4–10.5)
Creatinine, Ser: 0.6 mg/dL (ref 0.50–1.10)
GFR calc non Af Amer: >90 mL/min (ref >90)

## 2012-04-11 LAB — URINE MICROSCOPIC-ADD ON

## 2012-04-11 LAB — SURGICAL PCR SCREEN
MRSA, PCR: NEGATIVE
Staphylococcus aureus: NEGATIVE

## 2012-04-11 LAB — TYPE AND SCREEN
ABO/RH(D): O POS
Antibody Screen: NEGATIVE

## 2012-04-13 LAB — URINE CULTURE

## 2012-04-14 NOTE — Consult Note (Signed)
Anesthesia Chart Review:  Patient is a 61 year old female scheduled for right TKA on 04/18/12.  History includes non-smoker, obesity with BMI 41.8, HTN, anxiety, arthritis, gall bladder disease (scheduled for lap chole on 05/05/12 according to Epic), prior left TKA 04/2011.  PCP is Dr. Phillips Odor.  She was medically cleared prior to her last TKA.  Labs noted.  AST/ALT 38/43.  CBC, coags WNL.    CXR from 04/11/12 showed:  Stable mild cardiomegaly. No active lung disease.   EKG from 531/13 showed NSR, poor anterior R wave progression.  Overall, I think it is stable when compared to her prior EKG from 02/15/11 (Dr. Phillips Odor).    Anticipate she can proceed if no significant change in her status.  Shonna Chock, PA-C 04/14/12

## 2012-04-16 NOTE — H&P (Signed)
NAME: Heidi Rhodes MRN: #[id number] DATE: April 14, 2012 DOB: [birth year]  COMPLAINT:    Follow up right knee osteoarthritis.  HISTORY OF PRESENT ILLNESS:     The patient is a 61 year old female with a history of end-stage osteoarthritis of the right knee, also with hypertension and history of gallbladder problems.  She recently had a left total knee arthroplasty by Dr. Madelon Lips on 04/20/11 from which she is doing very well.  Today she is here to discuss her right knee osteoarthritis and continued knee pain.  She has failed conservative treatments with oral NSAIDs, corticosteroid injections, gait aids, and physical therapy.  The right knee pain is significantly affecting her quality of life and activities of daily living.  It will occasionally awaken her from sleep at night.  CURRENT MEDICATIONS:     Norvasc 6 mg daily and HCTZ 25 mg daily.  She is not currently taking any blood thinners.  ALLERGIES:     NKDA.  PAST MEDICAL HISTORY:     Hypertension.  The patient states she has gallbladder problems with possible planned cholecystectomy at the end of June.  PAST SURGICAL HISTORY:     Childbirth x 4 and appendectomy.  ROS:    Ten point review of systems is obtained.  It is positive for glasses/contacts, high blood pressure, some ankle swelling, and gallstones. Please refer to patient information form for details.  FAMILY HISTORY:    Positive for heart disease in mother and father, heart attack in mother and father, diabetes in father, and high blood pressure in mother, father, brother, and child.  SOCIAL HISTORY:    Nonsmoker.  Nondrinker.  She is married and lives with her husband in a one-story residence.  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    EXAM:     The patient is seated in the examination room in no acute distress.  She is alert, oriented, and appears appropriate age.  HT:  5'8"   WT:  275 pounds  BMI:  41.8  TEMP:  97.7 degrees  BP:  156/87   P:  77     R:   18  Skin:   No rashes, no lesions. HEENT:   PERRLA.  She has no dentures or oral implants.  Her neck is supple with good range of motion.    Chest:   Normal breath sounds, clear to auscultation bilaterally     Heart:   Regular rate and rhythm.  No signs of murmurs.   Abdomen:   Soft, non tender.  Active bowel sounds in all four quadrants. Rectal/Breast:    Not indicated for surgery. Muscles:     No tenderness, no muscle atrophy. Neuro:   Cranial nerves grossly intact. Musculoskeletal:    Examination of the right lower extremity shows she is neurovascularly intact.  He has tenderness to palpation along the medial joint line.  Positive patellofemoral crepitus.  Stable ligamentous testing.  She has no calf tenderness to palpation.  Dorsiflexion and plantar flexion of the ankle intact.  X-RAYS:     No images were obtained today.  X-rays taken of the right knee on 02/15/12 show severe osteoarthritis with bone-on-bone medial compartment.  IMPRESSION:      1. Right knee osteoarthritis, end stage. 2. Hypertension. 3. History of gallbladder problems (?).  RECOMMENDATIONS:     The risks and benefits of right total knee arthroplasty were discussed again today.  The patient swishes to proceed with the surgery.  She has a  preadmission appointment [facility] at [facility]. Surgery is scheduled for 04/18/12.  She does wish to go home following the surgery.  She currently has home health arranged through [place].  She was not required to obtain preoperative clearance as she had just done this for the left total knee replacement recently.  Her primary care physician is Dr. Assunta Found in [place].  She was given preoperative instructions.  We also discussed postoperative deep vein thrombosis prophylaxis and perioperative antibiotics.  She will be given Ancef and Lovenox following surgery.  If she has any further questions or concerns prior to surgery, she may contact our office.  Josh Rosezella Kronick  P.A.-C/[id number]  Auto-Authenticated by Estanislado Spire P.A.-C

## 2012-04-17 MED ORDER — ACETAMINOPHEN 10 MG/ML IV SOLN
1000.0000 mg | Freq: Four times a day (QID) | INTRAVENOUS | Status: DC
  Filled 2012-04-17 (×4): qty 100

## 2012-04-17 MED ORDER — SODIUM CHLORIDE 0.9 % IV SOLN: INTRAVENOUS | Status: DC

## 2012-04-17 MED ORDER — CHLORHEXIDINE GLUCONATE 4 % EX LIQD: 60.0000 mL | Freq: Once | CUTANEOUS | Status: DC

## 2012-04-17 MED ORDER — CEFAZOLIN SODIUM-DEXTROSE 2-3 GM-% IV SOLR
2.0000 g | INTRAVENOUS | Status: AC
  Administered 2012-04-18: 2 g via INTRAVENOUS
  Filled 2012-04-17: qty 50

## 2012-04-17 NOTE — Progress Notes (Signed)
PT CALLED AT HOME .Marland KitchenREGARDING TIME CHANGE... SHE WILL ARRIVE AT 0830 AM.

## 2012-04-18 ENCOUNTER — Inpatient Hospital Stay (HOSPITAL_COMMUNITY)
Admission: RE | Admit: 2012-04-18 | Discharge: 2012-04-21 | DRG: 209 | Disposition: A | Discharge status: Home Health | Payer: BC Managed Care – PPO | Source: Ambulatory Visit | Attending: Orthopedic Surgery | Admitting: Orthopedic Surgery

## 2012-04-18 ENCOUNTER — Ambulatory Visit (HOSPITAL_COMMUNITY): Payer: BC Managed Care – PPO | Admitting: Vascular Surgery

## 2012-04-18 ENCOUNTER — Encounter (HOSPITAL_COMMUNITY): Payer: Self-pay | Admitting: Vascular Surgery

## 2012-04-18 ENCOUNTER — Encounter (HOSPITAL_COMMUNITY)
Admission: RE | Disposition: A | Discharge status: Home / Self Care | Payer: Self-pay | Source: Ambulatory Visit | Attending: Orthopedic Surgery

## 2012-04-18 ENCOUNTER — Encounter (HOSPITAL_COMMUNITY): Payer: Self-pay | Admitting: *Deleted

## 2012-04-18 ENCOUNTER — Inpatient Hospital Stay (HOSPITAL_COMMUNITY): Payer: BC Managed Care – PPO

## 2012-04-18 DIAGNOSIS — M1710 Unilateral primary osteoarthritis, unspecified knee: Principal | ICD-10-CM | POA: Diagnosis present

## 2012-04-18 DIAGNOSIS — F411 Generalized anxiety disorder: Secondary | ICD-10-CM | POA: Diagnosis present

## 2012-04-18 DIAGNOSIS — Z9089 Acquired absence of other organs: Secondary | ICD-10-CM

## 2012-04-18 DIAGNOSIS — Z8249 Family history of ischemic heart disease and other diseases of the circulatory system: Secondary | ICD-10-CM

## 2012-04-18 DIAGNOSIS — I10 Essential (primary) hypertension: Secondary | ICD-10-CM | POA: Diagnosis present

## 2012-04-18 DIAGNOSIS — K829 Disease of gallbladder, unspecified: Secondary | ICD-10-CM | POA: Diagnosis present

## 2012-04-18 HISTORY — PX: TOTAL KNEE ARTHROPLASTY: SHX125

## 2012-04-18 SURGERY — ARTHROPLASTY, KNEE, TOTAL
Anesthesia: Regional | Site: Knee | Laterality: Right | Wound class: Clean

## 2012-04-18 MED ORDER — METHOCARBAMOL 100 MG/ML IJ SOLN
500.0000 mg | Freq: Four times a day (QID) | INTRAVENOUS | Status: DC | PRN
  Administered 2012-04-18: 500 mg via INTRAVENOUS
  Filled 2012-04-18: qty 5

## 2012-04-18 MED ORDER — ACETAMINOPHEN 650 MG RE SUPP: 650.0000 mg | Freq: Four times a day (QID) | RECTAL | Status: DC | PRN

## 2012-04-18 MED ORDER — HYOSCYAMINE SULFATE 0.125 MG SL SUBL
0.1250 mg | SUBLINGUAL_TABLET | SUBLINGUAL | Status: DC | PRN
  Filled 2012-04-18: qty 1

## 2012-04-18 MED ORDER — ENOXAPARIN SODIUM 30 MG/0.3ML ~~LOC~~ SOLN
30.0000 mg | Freq: Two times a day (BID) | SUBCUTANEOUS | Status: DC
  Administered 2012-04-19 – 2012-04-21 (×5): 30 mg via SUBCUTANEOUS
  Filled 2012-04-18 (×7): qty 0.3

## 2012-04-18 MED ORDER — HYDROMORPHONE HCL PF 1 MG/ML IJ SOLN: 1.0000 mg | INTRAMUSCULAR | Status: DC | PRN

## 2012-04-18 MED ORDER — FLEET ENEMA 7-19 GM/118ML RE ENEM: 1.0000 | ENEMA | Freq: Once | RECTAL | Status: AC | PRN

## 2012-04-18 MED ORDER — LIDOCAINE HCL 4 % MT SOLN
OROMUCOSAL | Status: DC | PRN
  Administered 2012-04-18: 4 mL via TOPICAL

## 2012-04-18 MED ORDER — PHENOL 1.4 % MT LIQD: 1.0000 | OROMUCOSAL | Status: DC | PRN

## 2012-04-18 MED ORDER — MIDAZOLAM HCL 2 MG/2ML IJ SOLN
INTRAMUSCULAR | Status: AC
  Filled 2012-04-18: qty 2

## 2012-04-18 MED ORDER — HYDROCHLOROTHIAZIDE 25 MG PO TABS
25.0000 mg | ORAL_TABLET | Freq: Every day | ORAL | Status: DC
Start: 2012-04-19 — End: 2012-04-21
  Administered 2012-04-19 – 2012-04-21 (×3): 25 mg via ORAL
  Filled 2012-04-18 (×3): qty 1

## 2012-04-18 MED ORDER — SODIUM CHLORIDE 0.9 % IV SOLN
INTRAVENOUS | Status: DC
  Administered 2012-04-19 (×2): via INTRAVENOUS

## 2012-04-18 MED ORDER — DOCUSATE SODIUM 100 MG PO CAPS
100.0000 mg | ORAL_CAPSULE | Freq: Two times a day (BID) | ORAL | Status: DC
  Administered 2012-04-18 – 2012-04-21 (×5): 100 mg via ORAL
  Filled 2012-04-18 (×8): qty 1

## 2012-04-18 MED ORDER — HYDRALAZINE HCL 20 MG/ML IJ SOLN
INTRAMUSCULAR | Status: DC | PRN
  Administered 2012-04-18: 10 mg via INTRAVENOUS
  Administered 2012-04-18 (×2): 5 mg via INTRAVENOUS

## 2012-04-18 MED ORDER — ACETAMINOPHEN 10 MG/ML IV SOLN
INTRAVENOUS | Status: AC
  Filled 2012-04-18: qty 100

## 2012-04-18 MED ORDER — NEOSTIGMINE METHYLSULFATE 1 MG/ML IJ SOLN
INTRAMUSCULAR | Status: DC | PRN
  Administered 2012-04-18: 4 mg via INTRAVENOUS

## 2012-04-18 MED ORDER — LIDOCAINE HCL (CARDIAC) 20 MG/ML IV SOLN
INTRAVENOUS | Status: DC | PRN
  Administered 2012-04-18: 50 mg via INTRAVENOUS

## 2012-04-18 MED ORDER — FENTANYL CITRATE 0.05 MG/ML IJ SOLN
INTRAMUSCULAR | Status: AC
  Filled 2012-04-18: qty 2

## 2012-04-18 MED ORDER — DEXTROSE 5 % IV SOLN
INTRAVENOUS | Status: DC | PRN
  Administered 2012-04-18: 11:00:00 via INTRAVENOUS

## 2012-04-18 MED ORDER — AMLODIPINE BESYLATE 5 MG PO TABS
5.0000 mg | ORAL_TABLET | Freq: Every day | ORAL | Status: DC
  Administered 2012-04-19 – 2012-04-21 (×3): 5 mg via ORAL
  Filled 2012-04-18 (×3): qty 1

## 2012-04-18 MED ORDER — DEXAMETHASONE SODIUM PHOSPHATE 4 MG/ML IJ SOLN
INTRAMUSCULAR | Status: DC | PRN
  Administered 2012-04-18: 8 mg via INTRAVENOUS

## 2012-04-18 MED ORDER — HYDROMORPHONE HCL PF 1 MG/ML IJ SOLN
INTRAMUSCULAR | Status: AC
  Filled 2012-04-18: qty 1

## 2012-04-18 MED ORDER — LACTATED RINGERS IV SOLN
INTRAVENOUS | Status: DC
  Administered 2012-04-18: 10:00:00 via INTRAVENOUS

## 2012-04-18 MED ORDER — ONDANSETRON HCL 4 MG PO TABS: 4.0000 mg | ORAL_TABLET | Freq: Four times a day (QID) | ORAL | Status: DC | PRN

## 2012-04-18 MED ORDER — SENNOSIDES-DOCUSATE SODIUM 8.6-50 MG PO TABS
1.0000 | ORAL_TABLET | Freq: Every evening | ORAL | Status: DC | PRN
  Administered 2012-04-21: 1 via ORAL
  Filled 2012-04-18: qty 1

## 2012-04-18 MED ORDER — FENTANYL CITRATE 0.05 MG/ML IJ SOLN
INTRAMUSCULAR | Status: DC | PRN
  Administered 2012-04-18 (×4): 50 ug via INTRAVENOUS
  Administered 2012-04-18: 100 ug via INTRAVENOUS
  Administered 2012-04-18: 150 ug via INTRAVENOUS
  Administered 2012-04-18: 50 ug via INTRAVENOUS

## 2012-04-18 MED ORDER — MENTHOL 3 MG MT LOZG: 1.0000 | LOZENGE | OROMUCOSAL | Status: DC | PRN

## 2012-04-18 MED ORDER — PROPOFOL 10 MG/ML IV EMUL
INTRAVENOUS | Status: DC | PRN
  Administered 2012-04-18: 180 mg via INTRAVENOUS
  Administered 2012-04-18: 20 mg via INTRAVENOUS

## 2012-04-18 MED ORDER — CEFAZOLIN SODIUM-DEXTROSE 2-3 GM-% IV SOLR
2.0000 g | Freq: Four times a day (QID) | INTRAVENOUS | Status: AC
  Administered 2012-04-18 (×2): 2 g via INTRAVENOUS
  Filled 2012-04-18 (×2): qty 50

## 2012-04-18 MED ORDER — MIDAZOLAM HCL 2 MG/2ML IJ SOLN
1.0000 mg | INTRAMUSCULAR | Status: DC | PRN
  Administered 2012-04-18: 1.5 mg via INTRAVENOUS
  Administered 2012-04-18: 0.5 mg via INTRAVENOUS

## 2012-04-18 MED ORDER — ACETAMINOPHEN 325 MG PO TABS
650.0000 mg | ORAL_TABLET | Freq: Four times a day (QID) | ORAL | Status: DC | PRN
  Administered 2012-04-20 – 2012-04-21 (×2): 650 mg via ORAL
  Filled 2012-04-18: qty 2

## 2012-04-18 MED ORDER — HYDROMORPHONE HCL PF 1 MG/ML IJ SOLN
0.2500 mg | INTRAMUSCULAR | Status: DC | PRN
  Administered 2012-04-18 (×4): 0.5 mg via INTRAVENOUS

## 2012-04-18 MED ORDER — ACETAMINOPHEN 10 MG/ML IV SOLN
1000.0000 mg | Freq: Four times a day (QID) | INTRAVENOUS | Status: AC
  Administered 2012-04-18 – 2012-04-19 (×4): 1000 mg via INTRAVENOUS
  Filled 2012-04-18 (×3): qty 100

## 2012-04-18 MED ORDER — LACTATED RINGERS IV SOLN
INTRAVENOUS | Status: DC | PRN
  Administered 2012-04-18 (×3): via INTRAVENOUS

## 2012-04-18 MED ORDER — SUCCINYLCHOLINE CHLORIDE 20 MG/ML IJ SOLN
INTRAMUSCULAR | Status: DC | PRN
  Administered 2012-04-18: 120 mg via INTRAVENOUS

## 2012-04-18 MED ORDER — ROCURONIUM BROMIDE 100 MG/10ML IV SOLN
INTRAVENOUS | Status: DC | PRN
  Administered 2012-04-18: 20 mg via INTRAVENOUS
  Administered 2012-04-18: 30 mg via INTRAVENOUS

## 2012-04-18 MED ORDER — GLYCOPYRROLATE 0.2 MG/ML IJ SOLN
INTRAMUSCULAR | Status: DC | PRN
Start: 2012-04-18 — End: 2012-04-18
  Administered 2012-04-18: .5 mg via INTRAVENOUS

## 2012-04-18 MED ORDER — METHOCARBAMOL 500 MG PO TABS
500.0000 mg | ORAL_TABLET | Freq: Four times a day (QID) | ORAL | Status: DC | PRN
  Administered 2012-04-18 – 2012-04-21 (×8): 500 mg via ORAL
  Filled 2012-04-18 (×10): qty 1

## 2012-04-18 MED ORDER — SODIUM CHLORIDE 0.9 % IR SOLN
Status: DC | PRN
  Administered 2012-04-18: 3000 mL

## 2012-04-18 MED ORDER — ZOLPIDEM TARTRATE 5 MG PO TABS: 5.0000 mg | ORAL_TABLET | Freq: Every evening | ORAL | Status: DC | PRN

## 2012-04-18 MED ORDER — ONDANSETRON HCL 4 MG/2ML IJ SOLN
INTRAMUSCULAR | Status: DC | PRN
  Administered 2012-04-18: 4 mg via INTRAVENOUS

## 2012-04-18 MED ORDER — MIDAZOLAM HCL 5 MG/5ML IJ SOLN
INTRAMUSCULAR | Status: DC | PRN
  Administered 2012-04-18: 2 mg via INTRAVENOUS

## 2012-04-18 MED ORDER — FENTANYL CITRATE 0.05 MG/ML IJ SOLN
50.0000 ug | INTRAMUSCULAR | Status: DC | PRN
  Administered 2012-04-18: 75 ug via INTRAVENOUS
  Administered 2012-04-18: 25 ug via INTRAVENOUS

## 2012-04-18 MED ORDER — DIPHENHYDRAMINE HCL 12.5 MG/5ML PO ELIX: 12.5000 mg | ORAL_SOLUTION | ORAL | Status: DC | PRN

## 2012-04-18 MED ORDER — METOCLOPRAMIDE HCL 10 MG PO TABS: 5.0000 mg | ORAL_TABLET | Freq: Three times a day (TID) | ORAL | Status: DC | PRN

## 2012-04-18 MED ORDER — OXYCODONE HCL 5 MG PO TABS
5.0000 mg | ORAL_TABLET | ORAL | Status: DC | PRN
  Administered 2012-04-18 – 2012-04-21 (×15): 10 mg via ORAL
  Filled 2012-04-18 (×15): qty 2

## 2012-04-18 MED ORDER — ONDANSETRON HCL 4 MG/2ML IJ SOLN: 4.0000 mg | Freq: Four times a day (QID) | INTRAMUSCULAR | Status: DC | PRN

## 2012-04-18 SURGICAL SUPPLY — 64 items
BANDAGE ESMARK 6X9 LF (GAUZE/BANDAGES/DRESSINGS) ×1 IMPLANT
BLADE SAGITTAL 25.0X1.19X90 (BLADE) ×2 IMPLANT
BLADE SAW SAG 90X13X1.27 (BLADE) ×2 IMPLANT
BNDG CMPR 9X6 STRL LF SNTH (GAUZE/BANDAGES/DRESSINGS) ×1
BNDG ESMARK 6X9 LF (GAUZE/BANDAGES/DRESSINGS) ×2
BOWL SMART MIX CTS (DISPOSABLE) ×2 IMPLANT
CEMENT HV SMART SET (Cement) ×3 IMPLANT
CLOTH BEACON ORANGE TIMEOUT ST (SAFETY) ×2 IMPLANT
COMP FEM CEM RT SZ4 (Orthopedic Implant) ×2 IMPLANT
COMPONENT FEM CEM RT SZ4 (Orthopedic Implant) IMPLANT
COVER BACK TABLE 24X17X13 BIG (DRAPES) IMPLANT
COVER SURGICAL LIGHT HANDLE (MISCELLANEOUS) ×2 IMPLANT
CUFF TOURNIQUET SINGLE 34IN LL (TOURNIQUET CUFF) ×2 IMPLANT
CUFF TOURNIQUET SINGLE 44IN (TOURNIQUET CUFF) IMPLANT
DRAPE INCISE IOBAN 66X45 STRL (DRAPES) IMPLANT
DRAPE ORTHO SPLIT 77X108 STRL (DRAPES) ×4
DRAPE SURG ORHT 6 SPLT 77X108 (DRAPES) ×2 IMPLANT
DRAPE U-SHAPE 47X51 STRL (DRAPES) ×2 IMPLANT
DRSG ADAPTIC 3X8 NADH LF (GAUZE/BANDAGES/DRESSINGS) ×2 IMPLANT
DRSG PAD ABDOMINAL 8X10 ST (GAUZE/BANDAGES/DRESSINGS) ×2 IMPLANT
DURAPREP 26ML APPLICATOR (WOUND CARE) ×2 IMPLANT
ELECT REM PT RETURN 9FT ADLT (ELECTROSURGICAL) ×2
ELECTRODE REM PT RTRN 9FT ADLT (ELECTROSURGICAL) ×1 IMPLANT
EVACUATOR 1/8 PVC DRAIN (DRAIN) ×2 IMPLANT
FACESHIELD LNG OPTICON STERILE (SAFETY) ×4 IMPLANT
FLOSEAL 10ML (HEMOSTASIS) IMPLANT
GLOVE BIOGEL PI IND STRL 8 (GLOVE) ×4 IMPLANT
GLOVE BIOGEL PI INDICATOR 8 (GLOVE) ×4
GLOVE ORTHO TXT STRL SZ7.5 (GLOVE) ×6 IMPLANT
GLOVE SURG ORTHO 8.0 STRL STRW (GLOVE) ×6 IMPLANT
GOWN PREVENTION PLUS XLARGE (GOWN DISPOSABLE) ×2 IMPLANT
GOWN PREVENTION PLUS XXLARGE (GOWN DISPOSABLE) ×2 IMPLANT
GOWN STRL NON-REIN LRG LVL3 (GOWN DISPOSABLE) ×4 IMPLANT
HANDPIECE INTERPULSE COAX TIP (DISPOSABLE) ×2
HOOD PEEL AWAY FACE SHEILD DIS (HOOD) ×2 IMPLANT
IMMOBILIZER KNEE 22 UNIV (SOFTGOODS) IMPLANT
INSERT TC3 TIBIAL SZ4 20.0 (Knees) ×1 IMPLANT
KIT BASIN OR (CUSTOM PROCEDURE TRAY) ×2 IMPLANT
KIT ROOM TURNOVER OR (KITS) ×2 IMPLANT
MANIFOLD NEPTUNE II (INSTRUMENTS) ×2 IMPLANT
NEEDLE 22X1 1/2 (OR ONLY) (NEEDLE) IMPLANT
NS IRRIG 1000ML POUR BTL (IV SOLUTION) ×2 IMPLANT
PACK TOTAL JOINT (CUSTOM PROCEDURE TRAY) ×2 IMPLANT
PACK UNIVERSAL I (CUSTOM PROCEDURE TRAY) ×1 IMPLANT
PAD ARMBOARD 7.5X6 YLW CONV (MISCELLANEOUS) ×4 IMPLANT
PAD CAST 4YDX4 CTTN HI CHSV (CAST SUPPLIES) ×1 IMPLANT
PADDING CAST COTTON 4X4 STRL (CAST SUPPLIES) ×2
PADDING CAST COTTON 6X4 STRL (CAST SUPPLIES) ×2 IMPLANT
PATELLA DOME PFC 38MM (Knees) ×1 IMPLANT
SET HNDPC FAN SPRY TIP SCT (DISPOSABLE) ×1 IMPLANT
SPONGE GAUZE 4X4 12PLY (GAUZE/BANDAGES/DRESSINGS) ×2 IMPLANT
STAPLER VISISTAT 35W (STAPLE) ×2 IMPLANT
SUCTION FRAZIER TIP 10 FR DISP (SUCTIONS) ×2 IMPLANT
SUT ETHIBOND NAB CT1 #1 30IN (SUTURE) ×4 IMPLANT
SUT VIC AB 0 CT1 27 (SUTURE) ×4
SUT VIC AB 0 CT1 27XBRD ANBCTR (SUTURE) ×2 IMPLANT
SUT VIC AB 2-0 CT1 27 (SUTURE) ×4
SUT VIC AB 2-0 CT1 TAPERPNT 27 (SUTURE) ×2 IMPLANT
SYR CONTROL 10ML LL (SYRINGE) IMPLANT
TOWEL OR 17X24 6PK STRL BLUE (TOWEL DISPOSABLE) ×2 IMPLANT
TOWEL OR 17X26 10 PK STRL BLUE (TOWEL DISPOSABLE) ×2 IMPLANT
TRAY FOLEY CATH 14FR (SET/KITS/TRAYS/PACK) ×2 IMPLANT
TRAY REVISION SZ 4 (Knees) ×1 IMPLANT
WATER STERILE IRR 1000ML POUR (IV SOLUTION) ×6 IMPLANT

## 2012-04-18 NOTE — Anesthesia Postprocedure Evaluation (Signed)
  Anesthesia Post-op Note  Patient: Heidi Rhodes  Procedure(s) Performed: Procedure(s) (LRB): TOTAL KNEE ARTHROPLASTY (Right)  Patient Location: PACU  Anesthesia Type: General  Level of Consciousness: awake, oriented and patient cooperative  Airway and Oxygen Therapy: Patient Spontanous Breathing and Patient connected to nasal cannula oxygen  Post-op Pain: mild  Post-op Assessment: Post-op Vital signs reviewed, Patient's Cardiovascular Status Stable, Respiratory Function Stable, Patent Airway, No signs of Nausea or vomiting and Pain level controlled  Post-op Vital Signs: stable  Complications: No apparent anesthesia complications

## 2012-04-18 NOTE — Interval H&P Note (Signed)
History and Physical Interval Note:  04/18/2012 10:02 AM  Heidi Rhodes  has presented today for surgery, with the diagnosis of osteoarthritis right knee  The various methods of treatment have been discussed with the patient and family. After consideration of risks, benefits and other options for treatment, the patient has consented to  Procedure(s) (LRB): TOTAL KNEE ARTHROPLASTY (Right) as a surgical intervention .  The patients' history has been reviewed, patient examined, no change in status, stable for surgery.  I have reviewed the patients' chart and labs.  Questions were answered to the patient's satisfaction.     [name],W D

## 2012-04-18 NOTE — Op Note (Signed)
NAME:  JAMIRRA, CURNOW NO.:  [id number]  MEDICAL RECORD NO.:  [id number]  LOCATION:  5004                         FACILITY:  [facility]  PHYSICIAN:  Dyke Brackett, M.D.    DATE OF BIRTH:  07-13-1951  DATE OF PROCEDURE:  04/18/2012 DATE OF DISCHARGE:                              OPERATIVE REPORT   INDICATIONS:  A 61 year old with painful right knee, severe varus deformity status post left total knee with morbid obesity, thought to be amenable to hospitalization knee replacement.  PREOPERATIVE DIAGNOSIS:  Osteoarthritis, right knee.  Varus deformity, right knee.  POSTOPERATIVE DIAGNOSIS:  Osteoarthritis, right knee.  Varus deformity, right knee.  OPERATION:  Right total knee replacement with DePuy Sigma TC3 size 4 femur, size 4 cm MBT tray with 20 mm bearing and 38 mm poly patella.  SURGEON:  Dyke Brackett, MD  ASSISTANT:  Margart Sickles, PA-C  ANESTHESIA:  General with nerve block.  TOURNIQUET TIME:  1 hour and 30 minutes.  DESCRIPTION OF PROCEDURE:  After sterile prep and drape, exsanguination of leg, inflation to 400 due to the large size of legs.  Straight skin incision was made with medial parapatellar approach to the knee made. We saw significant amount of degeneration and actually attenuation of the medial compartment as well as the ligamentous structures were very attenuated.  We initially cut of the femur with a 5-degree valgus cut, 3- 4 mm below the most diseased medial compartment.  I was impressed with how attenuated and damaged the medial side of the knee was.  We then actually initially seemed to have symmetric extension gap at 10 mm, but that was initially obviously radically changed.  We then proceeded onward to size the femur to be 4, we set the appropriate rotation off the jig and then placed the cutting block and cut the anterior-posterior chamfers.  We next placed a spacer block, which seems relatively symmetric at 10 initially, but there  was somewhat of a lot of roll back relative to the femur, I would note that again there was hyperextension that on x-ray despite limitation of flexion to only 90 degrees, and we did proceed on with the tibia.  The tibia was cut with revision mobile bearing tray due to the patient's large BMI for better stability and better potential wear characteristics.  We placed that trial and with the appropriate reaming depth size 4, then we put a box cut on the femur and at that point also cut the patella leaving about 15 mm of native patella for 3 PEG patella.  We then trialed this and despite the initial that we were at 10.20/12.5 mm range it was obvious that we went up dramatically to about 15-17 bearing and then at that point, it was realized that the collateral ligament medially was very insufficient.  I thought we needed to go to a more constrained prosthesis for these reasons.  We then converted this over to a TC3 femur and we cut the box. We trialed it, and after trialing to a 20 mm bearing.  We did feel very good about the stability of the knee with full extension.  No tendency toward varus valgus instability.  No tendency for bearing spin out or dislocation of the knee.  All the trial components were removed. Copious pulsatile irrigation.  We then inserted the tibia followed by the femur patella.  The final component was inserted as well.  The cement was allowed to harden.  Excess cement was then removed.  All trial prosthesis were placed through range of motion with excellent stability noted.  All parameters were acceptable.  Tourniquet was then released.  Hemovac drain was placed superolaterally.  Then closed the capsule with interrupted #1 Ethibond.  The subcutaneous tissues with 0 and 2-0 Vicryl.  The skin was stapled.  Light compressive sterile dressing was applied.  Taken to the recovery room in a stable condition.     Dyke Brackett, M.D.     WDC/MEDQ  D:  04/18/2012  T:   04/18/2012  Job:  [id number]

## 2012-04-18 NOTE — Anesthesia Procedure Notes (Addendum)
Anesthesia Regional Block:  Femoral nerve block  Pre-Anesthetic Checklist: ,, timeout performed, Correct Patient, Correct Site, Correct Laterality, Correct Procedure, Correct Position, site marked, Risks and benefits discussed,  Surgical consent,  Pre-op evaluation,  At surgeon's request and post-op pain management  Laterality: Right  Prep: Maximum Sterile Barrier Precautions used and chloraprep       Needles:  Injection technique: Single-shot  Needle Type: Stimulator Needle - 80          Additional Needles:  Procedures: other  ( ultrasound guided 30cc total 0.5%marcaine w/1:200,000 epi 5) Femoral nerve block  Nerve Stimulator or Paresthesia:  Response: 0.5 mA,   Additional Responses:   Narrative:  Start time: 04/18/2012 10:15 AM End time: 04/18/2012 10:30 AM Injection made incrementally with aspirations every 5 mL.  Performed by: Personally    Procedure Name: Intubation Date/Time: 04/18/2012 10:46 AM Performed by: Nicholos Johns Pre-anesthesia Checklist: Patient identified, Emergency Drugs available, Patient being monitored, Suction available and Timeout performed Patient Re-evaluated:Patient Re-evaluated prior to inductionOxygen Delivery Method: Circle system utilized Preoxygenation: Pre-oxygenation with 100% oxygen Intubation Type: IV induction Ventilation: Mask ventilation without difficulty Laryngoscope Size: Mac and 3 Grade View: Grade I Tube type: Oral Tube size: 7.5 mm Number of attempts: 1 Airway Equipment and Method: Stylet Placement Confirmation: ETT inserted through vocal cords under direct vision,  positive ETCO2 and breath sounds checked- equal and bilateral Secured at: 22 cm Tube secured with: Tape Dental Injury: Teeth and Oropharynx as per pre-operative assessment

## 2012-04-18 NOTE — Op Note (Signed)
Dictated

## 2012-04-18 NOTE — Anesthesia Preprocedure Evaluation (Addendum)
Anesthesia Evaluation  Patient identified by MRN, date of birth, ID band Patient awake    Reviewed: Allergy & Precautions, H&P , NPO status , Patient's Chart, lab work & pertinent test results  Airway Mallampati: II  Neck ROM: full    Dental  (+) Teeth Intact and Dental Advisory Given   Pulmonary neg pulmonary ROS,  breath sounds clear to auscultation        Cardiovascular hypertension, Pt. on medications Rhythm:Regular Rate:Normal - Carotid Bruit    Neuro/Psych Anxiety negative neurological ROS     GI/Hepatic negative GI ROS, Neg liver ROS,   Endo/Other  negative endocrine ROS  Renal/GU negative Renal ROS     Musculoskeletal  (+) Arthritis -, Osteoarthritis,    Abdominal   Peds  Hematology negative hematology ROS (+)   Anesthesia Other Findings   Reproductive/Obstetrics negative OB ROS                        Anesthesia Physical Anesthesia Plan  ASA: II  Anesthesia Plan: General and Regional   Post-op Pain Management: MAC Combined w/ Regional for Post-op pain   Induction: Intravenous  Airway Management Planned: Oral ETT  Additional Equipment:   Intra-op Plan:   Post-operative Plan: Extubation in OR  Informed Consent: I have reviewed the patients History and Physical, chart, labs and discussed the procedure including the risks, benefits and alternatives for the proposed anesthesia with the patient or authorized representative who has indicated his/her understanding and acceptance.   Dental advisory given  Plan Discussed with: CRNA and Surgeon  Anesthesia Plan Comments:       Anesthesia Quick Evaluation

## 2012-04-18 NOTE — H&P (View-Only) (Signed)
 NAME: Heidi Rhodes MRN: #[id number] DATE: April 14, 2012 DOB: 01/11/1951  COMPLAINT:    Follow up right knee osteoarthritis.  HISTORY OF PRESENT ILLNESS:     The patient is a 61-year-old female with a history of end-stage osteoarthritis of the right knee, also with hypertension and history of gallbladder problems.  She recently had a left total knee arthroplasty by Dr. Caffrey on 04/20/11 from which she is doing very well.  Today she is here to discuss her right knee osteoarthritis and continued knee pain.  She has failed conservative treatments with oral NSAIDs, corticosteroid injections, gait aids, and physical therapy.  The right knee pain is significantly affecting her quality of life and activities of daily living.  It will occasionally awaken her from sleep at night.  CURRENT MEDICATIONS:     Norvasc 6 mg daily and HCTZ 25 mg daily.  She is not currently taking any blood thinners.  ALLERGIES:     NKDA.  PAST MEDICAL HISTORY:     Hypertension.  The patient states she has gallbladder problems with possible planned cholecystectomy at the end of June.  PAST SURGICAL HISTORY:     Childbirth x 4 and appendectomy.  ROS:    Ten point review of systems is obtained.  It is positive for glasses/contacts, high blood pressure, some ankle swelling, and gallstones. Please refer to patient information form for details.  FAMILY HISTORY:    Positive for heart disease in mother and father, heart attack in mother and father, diabetes in father, and high blood pressure in mother, father, brother, and child.  SOCIAL HISTORY:    Nonsmoker.  Nondrinker.  She is married and lives with her husband in a one-story residence.  She is retired, but previously worked as a schoolteacher.    EXAM:     The patient is seated in the examination room in no acute distress.  She is alert, oriented, and appears appropriate age.  HT:  5'8"   WT:  275 pounds  BMI:  41.8  TEMP:  97.7 degrees  BP:  156/87   P:  77     R:   18  Skin:   No rashes, no lesions. HEENT:   PERRLA.  She has no dentures or oral implants.  Her neck is supple with good range of motion.    Chest:   Normal breath sounds, clear to auscultation bilaterally     Heart:   Regular rate and rhythm.  No signs of murmurs.   Abdomen:   Soft, non tender.  Active bowel sounds in all four quadrants. Rectal/Breast:    Not indicated for surgery. Muscles:     No tenderness, no muscle atrophy. Neuro:   Cranial nerves grossly intact. Musculoskeletal:    Examination of the right lower extremity shows she is neurovascularly intact.  He has tenderness to palpation along the medial joint line.  Positive patellofemoral crepitus.  Stable ligamentous testing.  She has no calf tenderness to palpation.  Dorsiflexion and plantar flexion of the ankle intact.  X-RAYS:     No images were obtained today.  X-rays taken of the right knee on 02/15/12 show severe osteoarthritis with bone-on-bone medial compartment.  IMPRESSION:      1. Right knee osteoarthritis, end stage. 2. Hypertension. 3. History of gallbladder problems (?).  RECOMMENDATIONS:     The risks and benefits of right total knee arthroplasty were discussed again today.  The patient swishes to proceed with the surgery.  She has a   preadmission appointment scheduled at [facility]. Surgery is scheduled for 04/18/12.  She does wish to go home following the surgery.  She currently has home health arranged through [facility].  She was not required to obtain preoperative clearance as she had just done this for the left total knee replacement recently.  Her primary care physician is Dr. John Golding in [place].  She was given preoperative instructions.  We also discussed postoperative deep vein thrombosis prophylaxis and perioperative antibiotics.  She will be given Ancef and Lovenox following surgery.  If she has any further questions or concerns prior to surgery, she may contact our office.  Josh Corday Wyka  P.A.-C/[id number]  Auto-Authenticated by Josh Ashyia Schraeder P.A.-C 

## 2012-04-18 NOTE — Transfer of Care (Signed)
Immediate Anesthesia Transfer of Care Note  Patient: Heidi Rhodes  Procedure(s) Performed: Procedure(s) (LRB): TOTAL KNEE ARTHROPLASTY (Right)  Patient Location: PACU  Anesthesia Type: General and GA combined with regional for post-op pain  Level of Consciousness: awake, alert  and oriented  Airway & Oxygen Therapy: Patient Spontanous Breathing and Patient connected to nasal cannula oxygen  Post-op Assessment: Report given to PACU RN and Post -op Vital signs reviewed and stable  Post vital signs: Reviewed and stable  Complications: No apparent anesthesia complications

## 2012-04-18 NOTE — Progress Notes (Signed)
Orthopedic Tech Progress Note Patient Details:  Heidi Rhodes [facility] Feb 15, 1951 [id number]  CPM Right Knee CPM Right Knee: On Right Knee Flexion (Degrees): 60  Right Knee Extension (Degrees): 0  Additional Comments: trapeze bar   Cammer, Mickie Bail 04/18/2012, 2:58 PM

## 2012-04-18 NOTE — Preoperative (Signed)
Beta Blockers   Reason not to administer Beta Blockers:Not Applicable 

## 2012-04-19 LAB — BASIC METABOLIC PANEL
CO2: 26 mEq/L (ref 19–32)
Calcium: 8.9 mg/dL (ref 8.4–10.5)
Chloride: 100 mEq/L (ref 96–112)
Creatinine, Ser: 0.59 mg/dL (ref 0.50–1.10)
GFR calc Af Amer: >90 mL/min (ref >90)
GFR calc non Af Amer: >90 mL/min (ref >90)
Potassium: 4.3 mEq/L (ref 3.5–5.1)
Sodium: 137 mEq/L (ref 135–145)

## 2012-04-19 LAB — CBC
MCV: 87.4 fL (ref 78.0–100.0)
Platelets: 212 10*3/uL (ref 150–400)
RBC: 4.2 MIL/uL (ref 3.87–5.11)
WBC: 10.8 10*3/uL — ABNORMAL HIGH (ref 4.0–10.5)

## 2012-04-19 NOTE — Progress Notes (Signed)
OT Note: OT consult received and appreciated. Pt. Presents with no current OT needs due to recalling all education from prior TKA and with all needed DME. Will sign off acutely and encouraged continued participation with P.T.   Cassandria Anger, OTR/L Pager: [phone] 04/19/2012 .

## 2012-04-19 NOTE — Progress Notes (Signed)
CSW consulted for SNF. PT recommendation for HHPT noted. CSW signing off as no other CSW needs identified. Please re-consult if SNF needed. Dellie Burns, MSW, [place] [phone] (weekend)

## 2012-04-19 NOTE — Progress Notes (Signed)
Subjective: 1 Day Post-Op Procedure(s) (LRB): TOTAL KNEE ARTHROPLASTY (Right) Patient reports pain as moderate.    Objective: Vital signs in last 24 hours: Temp:  [97.5 F (36.4 C)-99.3 F (37.4 C)] 99.3 F (37.4 C) (06/08 0548) Pulse Rate:  [67-94] 86  (06/08 0548) Resp:  [11-28] 19  (06/08 0548) BP: (106-154)/(53-79) 118/68 mmHg (06/08 0548) SpO2:  [94 %-100 %] 97 % (06/08 0548)  Intake/Output from previous day: 06/07 0701 - 06/08 0700 In: 2575 [I.V.:2425; IV Piggyback:150] Out: 3905 [Urine:2875; Drains:830; Blood:200] Intake/Output this shift:     Basename 04/19/12 0535  HGB 12.3    Basename 04/19/12 0535  WBC 10.8*  RBC 4.20  HCT 36.7  PLT 212    Basename 04/19/12 0535  NA 137  K 4.3  CL 100  CO2 26  BUN 15  CREATININE 0.59  GLUCOSE 128*  CALCIUM 8.9   No results found for this basename: LABPT:2,INR:2 in the last 72 hours  Neurovascular intact  Assessment/Plan: 1 Day Post-Op Procedure(s) (LRB): TOTAL KNEE ARTHROPLASTY (Right) Up with therapy  Shulamis Wenberg JR,W D 04/19/2012, 9:22 AM

## 2012-04-19 NOTE — Progress Notes (Signed)
Physical Therapy Treatment Patient Details Name: Heidi Rhodes MRN: [id number] DOB: 04/02/1951 Today's Date: 04/19/2012 Time: 5621-3086 PT Time Calculation (min): 40 min  PT Assessment / Plan / Recommendation Comments on Treatment Session       Follow Up Recommendations  Home health PT    Barriers to Discharge None      Equipment Recommendations  None recommended by PT    Recommendations for Other Services    Frequency 7X/week   Plan Discharge plan remains appropriate;Frequency remains appropriate    Precautions / Restrictions Precautions Precautions: Knee Precaution Booklet Issued: Yes (comment) Required Braces or Orthoses: Knee Immobilizer - Right Knee Immobilizer - Right: On when out of bed or walking Restrictions RLE Weight Bearing: Weight bearing as tolerated   Pertinent Vitals/Pain 4/10    Mobility  Bed Mobility Bed Mobility: Sit to Supine Supine to Sit: 4: Min assist;With rails;HOB elevated Sit to Supine: 4: Min assist Details for Bed Mobility Assistance: assist with BLE for sit to supine Transfers Transfers: Sit to Stand;Stand to Sit;Stand Pivot Transfers Sit to Stand: 4: Min assist;From chair/3-in-1;With armrests Stand to Sit: 4: Min assist;With armrests;To chair/3-in-1;To bed Stand Pivot Transfers: 4: Min assist;Other (comment) (with RW) Details for Transfer Assistance: verbal cues for sequencing, hand placement    Exercises Total Joint Exercises Ankle Circles/Pumps: AROM;Both;10 reps Quad Sets: AROM;Right;10 reps Heel Slides: AAROM;Right;10 reps Hip ABduction/ADduction: AAROM;Right;10 reps Goniometric ROM: 0-50 R knee AAROM   PT Diagnosis: Difficulty walking;Acute pain  PT Problem List: Decreased strength;Decreased range of motion;Decreased activity tolerance;Decreased mobility;Pain PT Treatment Interventions: DME instruction;Gait training;Stair training;Functional mobility training;Therapeutic activities;Therapeutic exercise;Patient/family  education   PT Goals Acute Rehab PT Goals PT Goal Formulation: With patient Time For Goal Achievement: 04/26/12 Potential to Achieve Goals: Good Pt will go Supine/Side to Sit: with modified independence;with HOB 0 degrees PT Goal: Supine/Side to Sit - Progress: Goal set today Pt will go Sit to Supine/Side: with modified independence;with HOB 0 degrees PT Goal: Sit to Supine/Side - Progress: Goal set today Pt will go Sit to Stand: with modified independence;with upper extremity assist PT Goal: Sit to Stand - Progress: Goal set today Pt will go Stand to Sit: with modified independence;with upper extremity assist PT Goal: Stand to Sit - Progress: Goal set today Pt will Transfer Bed to Chair/Chair to Bed: with modified independence PT Transfer Goal: Bed to Chair/Chair to Bed - Progress: Goal set today Pt will Ambulate: >150 feet;with modified independence;with rolling walker PT Goal: Ambulate - Progress: Goal set today Pt will Go Up / Down Stairs: 1-2 stairs;with min assist;with least restrictive assistive device PT Goal: Up/Down Stairs - Progress: Goal set today Pt will Perform Home Exercise Program: with supervision, verbal cues required/provided PT Goal: Perform Home Exercise Program - Progress: Goal set today  Visit Information  Last PT Received On: 04/19/12 Assistance Needed: +1    Subjective Data  Subjective: "It feels good to be out of the bed." Patient Stated Goal: home   Cognition  Overall Cognitive Status: Appears within functional limits for tasks assessed/performed Arousal/Alertness: Awake/alert Orientation Level: Oriented X4 / Intact Behavior During Session: [facility] for tasks performed    Balance     End of Session PT - End of Session Equipment Utilized During Treatment: Gait belt;Right knee immobilizer Activity Tolerance: Patient tolerated treatment well Patient left: in bed;with call bell/phone within reach;with family/visitor present Nurse Communication: Mobility  status CPM Right Knee CPM Right Knee: On Right Knee Flexion (Degrees): 55  Right Knee Extension (Degrees): 0  Ilda Foil 04/19/2012, 2:26 PM  Aida Raider, PT  Office # [phone] Pager [phone]

## 2012-04-19 NOTE — Brief Op Note (Signed)
04/18/2012  8:30 AM  PATIENT:  Heidi Rhodes  60 y.o. female  PRE-OPERATIVE DIAGNOSIS:  osteoarthritis right knee  POST-OPERATIVE DIAGNOSIS:  osteoarthritis right knee  PROCEDURE:  Procedure(s) (LRB): TOTAL KNEE ARTHROPLASTY (Right)  SURGEON:  Surgeon(s) and Role:    * W D Carloyn Manner., MD - Primary  PHYSICIAN ASSISTANT: Ivin Booty A. Anokhi Shannon, PA-C  ASSISTANTS:   ANESTHESIA:   general  EBL:     BLOOD ADMINISTERED:none  DRAINS:Hemovac to self suction R knee  LOCAL MEDICATIONS USED:  NONE  SPECIMEN:  No Specimen  DISPOSITION OF SPECIMEN:  N/A  COUNTS:  YES  TOURNIQUET:   Total Tourniquet Time Documented: Thigh (Right) - 93 minutes  DICTATION: .Other Dictation: Dictation Number   PLAN OF CARE: Admit to inpatient   PATIENT DISPOSITION:  PACU - hemodynamically stable.   Delay start of Pharmacological VTE agent (>24hrs) due to surgical blood loss or risk of bleeding: yes

## 2012-04-19 NOTE — Evaluation (Signed)
Physical Therapy Evaluation Patient Details Name: Heidi Rhodes MRN: [id number] DOB: [birth year] Today's Date: 04/19/2012 Time: 0865-7846 PT Time Calculation (min): 52 min  PT Assessment / Plan / Recommendation Clinical Impression  Pt is a 61 y.o. female who underwent R TKA 04-18-12.  She underwent L TKA approx 1 year ago.  Skilled PT intervention indicated to obtain max functional mobility status and provide education to ensure safe d/c home with husband    PT Assessment  Patient needs continued PT services    Follow Up Recommendations  Home health PT    Barriers to Discharge None      lEquipment Recommendations  None recommended by PT    Recommendations for Other Services     Frequency 7X/week    Precautions / Restrictions Precautions Precautions: Knee Required Braces or Orthoses: Knee Immobilizer - Right Knee Immobilizer - Right: On when out of bed or walking Restrictions RLE Weight Bearing: Weight bearing as tolerated   Pertinent Vitals/Pain 5/10      Mobility  Bed Mobility Bed Mobility: Supine to Sit Supine to Sit: 4: Min assist;With rails;HOB elevated Details for Bed Mobility Assistance: verbal cues for sequencing Transfers Transfers: Sit to Stand;Stand to Sit;Stand Pivot Transfers Sit to Stand: 4: Min assist Stand to Sit: 4: Min assist Stand Pivot Transfers: 4: Min assist Details for Transfer Assistance: verbal cues for sequencing, hand placement Ambulation/Gait Ambulation/Gait Assistance: 4: Min guard Assistive device: Rolling walker Ambulation/Gait Assistance Details: verbal cues for sequencing and posture Gait Pattern: Step-to pattern;Antalgic Gait velocity: decreased    Exercises Total Joint Exercises Ankle Circles/Pumps: AROM;Both;10 reps Goniometric ROM: 0-50 R knee AAROM   PT Diagnosis: Difficulty walking;Acute pain  PT Problem List: Decreased strength;Decreased range of motion;Decreased activity tolerance;Decreased mobility;Pain PT Treatment  Interventions: DME instruction;Gait training;Stair training;Functional mobility training;Therapeutic activities;Therapeutic exercise;Patient/family education   PT Goals Acute Rehab PT Goals PT Goal Formulation: With patient Time For Goal Achievement: 04/26/12 Potential to Achieve Goals: Good Pt will go Supine/Side to Sit: with modified independence;with HOB 0 degrees PT Goal: Supine/Side to Sit - Progress: Goal set today Pt will go Sit to Supine/Side: with modified independence;with HOB 0 degrees PT Goal: Sit to Supine/Side - Progress: Goal set today Pt will go Sit to Stand: with modified independence;with upper extremity assist PT Goal: Sit to Stand - Progress: Goal set today Pt will go Stand to Sit: with modified independence;with upper extremity assist PT Goal: Stand to Sit - Progress: Goal set today Pt will Transfer Bed to Chair/Chair to Bed: with modified independence PT Transfer Goal: Bed to Chair/Chair to Bed - Progress: Goal set today Pt will Ambulate: >150 feet;with modified independence;with rolling walker PT Goal: Ambulate - Progress: Goal set today Pt will Go Up / Down Stairs: 1-2 stairs;with min assist;with least restrictive assistive device PT Goal: Up/Down Stairs - Progress: Goal set today Pt will Perform Home Exercise Program: with supervision, verbal cues required/provided PT Goal: Perform Home Exercise Program - Progress: Goal set today  Visit Information  Last PT Received On: 04/19/12 Assistance Needed: +1    Subjective Data  Subjective: "I am ready." Patient Stated Goal: home   Prior Functioning  Home Living Lives With: Spouse Available Help at Discharge: Family Type of Home: House Home Access: Stairs to enter Secretary/administrator of Steps: 2 Home Layout: One level Home Adaptive Equipment: Bedside commode/3-in-1;Walker - rolling Prior Function Level of Independence: Independent Able to Take Stairs?: Yes Driving: Yes Vocation:  Retired Musician: No difficulties    Cognition  Overall Cognitive Status: Appears within functional limits for tasks assessed/performed Arousal/Alertness: Awake/alert Orientation Level: Oriented X4 / Intact Behavior During Session: [facility] for tasks performed    Extremity/Trunk Assessment     Balance    End of Session PT - End of Session Equipment Utilized During Treatment: Gait belt;Right knee immobilizer Activity Tolerance: Patient tolerated treatment well Patient left: in chair;with call bell/phone within reach;with family/visitor present Nurse Communication: Mobility status   Ilda Foil 04/19/2012, 11:46 AM  Aida Raider, PT  Office # [phone] Pager [phone]

## 2012-04-19 NOTE — Progress Notes (Signed)
Orthopedic Tech Progress Note Patient Details:  Heidi Rhodes [facility] Jul 02, 1951 [id number]  CPM Right Knee CPM Right Knee: On Right Knee Flexion (Degrees): 60  Right Knee Extension (Degrees): 0  Additional Comments: trapeze bar   Cammer, Mickie Bail 04/19/2012, 12:01 AM

## 2012-04-20 LAB — CBC
Hemoglobin: 10.9 g/dL — ABNORMAL LOW (ref 12.0–15.0)
MCH: 29.5 pg (ref 26.0–34.0)
MCHC: 33.5 g/dL (ref 30.0–36.0)
Platelets: 159 10*3/uL (ref 150–400)
RDW: 13.3 % (ref 11.5–15.5)

## 2012-04-20 NOTE — Progress Notes (Signed)
Subjective: 2 Days Post-Op Procedure(s) (LRB): TOTAL KNEE ARTHROPLASTY (Right) Patient reports pain as mild and moderate.    Objective: Vital signs in last 24 hours: Temp:  [98.8 F (37.1 C)-99.7 F (37.6 C)] 99.6 F (37.6 C) (06/09 0609) Pulse Rate:  [85-100] 97  (06/09 0609) Resp:  [16-18] 18  (06/09 0609) BP: (131-157)/(62-77) 157/62 mmHg (06/09 0609) SpO2:  [92 %-96 %] 92 % (06/09 0609) Weight:  [122.471 kg (270 lb)] 122.471 kg (270 lb) (06/09 0339)  Intake/Output from previous day: 06/08 0701 - 06/09 0700 In: 1830 [P.O.:840; I.V.:990] Out: 75 [Drains:75] Intake/Output this shift: Total I/O In: 745 [P.O.:720; Other:25] Out: -    Basename 04/20/12 0605 04/19/12 0535  HGB 10.9* 12.3    Basename 04/20/12 0605 04/19/12 0535  WBC 8.0 10.8*  RBC 3.69* 4.20  HCT 32.5* 36.7  PLT 159 212    Basename 04/19/12 0535  NA 137  K 4.3  CL 100  CO2 26  BUN 15  CREATININE 0.59  GLUCOSE 128*  CALCIUM 8.9   No results found for this basename: LABPT:2,INR:2 in the last 72 hours  Neurovascular intact Sensation intact distally Intact pulses distally Dorsiflexion/Plantar flexion intact Incision: dressing C/D/I No cellulitis present Compartment soft Dressing changed and drain pulled today without complication  Assessment/Plan: 2 Days Post-Op Procedure(s) (LRB): TOTAL KNEE ARTHROPLASTY (Right) Up with therapy Plan for discharge tomorrow Pain control  DVT proph   Margart Sickles 04/20/2012, 10:51 AM

## 2012-04-20 NOTE — Progress Notes (Signed)
Physical Therapy Treatment Patient Details Name: Heidi Rhodes MRN: [id number] DOB: 06-09-1951 Today's Date: 04/20/2012 Time: 4098-1191 PT Time Calculation (min): 27 min  PT Assessment / Plan / Recommendation Comments on Treatment Session       Follow Up Recommendations  Home health PT    Barriers to Discharge        Equipment Recommendations  None recommended by PT    Recommendations for Other Services    Frequency 7X/week   Plan Discharge plan remains appropriate;Frequency remains appropriate    Precautions / Restrictions Precautions Precautions: Knee Required Braces or Orthoses: Knee Immobilizer - Right Knee Immobilizer - Right: On when out of bed or walking Restrictions RLE Weight Bearing: Weight bearing as tolerated   Pertinent Vitals/Pain 3/10    Mobility  Bed Mobility Supine to Sit: 4: Min assist;With rails;HOB elevated Sit to Supine: 4: Min assist;With rail;HOB flat Details for Bed Mobility Assistance: assist with BLE for sit to supine, verbal cues for sequencing Transfers Sit to Stand: 4: Min assist;From bed;With upper extremity assist Stand to Sit: 4: Min assist;With upper extremity assist;To bed Details for Transfer Assistance: verbal cues for hand placement, sequencing Ambulation/Gait Ambulation/Gait Assistance: 4: Min guard Ambulation Distance (Feet): 30 Feet Assistive device: Rolling walker Ambulation/Gait Assistance Details: verbal cues to look forward and keep eyes open Gait Pattern: Step-to pattern;Antalgic Gait velocity: decreased    Exercises Total Joint Exercises Ankle Circles/Pumps: AROM;Both;10 reps;Supine Quad Sets: AROM;Right;10 reps;Supine Heel Slides: AAROM;Right;10 reps;Supine Hip ABduction/ADduction: AAROM;Right;10 reps;Supine   PT Diagnosis:    PT Problem List:   PT Treatment Interventions:     PT Goals Acute Rehab PT Goals PT Goal: Supine/Side to Sit - Progress: Progressing toward goal PT Goal: Sit to Supine/Side - Progress:  Progressing toward goal PT Goal: Sit to Stand - Progress: Progressing toward goal PT Goal: Stand to Sit - Progress: Progressing toward goal PT Transfer Goal: Bed to Chair/Chair to Bed - Progress: Progressing toward goal PT Goal: Ambulate - Progress: Progressing toward goal PT Goal: Up/Down Stairs - Progress: Progressing toward goal PT Goal: Perform Home Exercise Program - Progress: Progressing toward goal  Visit Information  Last PT Received On: 04/20/12 Assistance Needed: +1    Subjective Data  Subjective: "My knee is stiff."   Cognition  Overall Cognitive Status: Appears within functional limits for tasks assessed/performed Arousal/Alertness: Awake/alert Orientation Level: Oriented X4 / Intact Behavior During Session: [facility] for tasks performed    Balance     End of Session PT - End of Session Equipment Utilized During Treatment: Gait belt;Right knee immobilizer Activity Tolerance: Patient tolerated treatment well Patient left: in bed;with call bell/phone within reach;with family/visitor present CPM Right Knee CPM Right Knee: On Right Knee Flexion (Degrees): 60  Right Knee Extension (Degrees): 0     Ilda Foil 04/20/2012, 10:16 AM  Aida Raider, PT  Office # [phone] Pager [phone]

## 2012-04-20 NOTE — Progress Notes (Signed)
Physical Therapy Treatment Patient Details Name: Heidi Rhodes MRN: [id number] DOB: [birth year] Today's Date: 04/20/2012 Time: 1191-4782 PT Time Calculation (min): 27 min  PT Assessment / Plan / Recommendation Comments on Treatment Session  Pt progressing well. D/C is planned for tomorrow.    Follow Up Recommendations  Home health PT    Barriers to Discharge        Equipment Recommendations  None recommended by PT    Recommendations for Other Services    Frequency 7X/week   Plan Discharge plan remains appropriate;Frequency remains appropriate    Precautions / Restrictions Precautions Precautions: Knee Required Braces or Orthoses: Knee Immobilizer - Right Knee Immobilizer - Right: On when out of bed or walking Restrictions RLE Weight Bearing: Weight bearing as tolerated   Pertinent Vitals/Pain 2/10    Mobility  Bed Mobility Bed Mobility: Supine to Sit Supine to Sit: HOB flat;4: Min assist Transfers Sit to Stand: 4: Min guard;From chair/3-in-1;With armrests Stand to Sit: 4: Min guard;With armrests;To chair/3-in-1 Details for Transfer Assistance: verbal cues for sequencing Ambulation/Gait Ambulation/Gait Assistance: 4: Min guard Ambulation Distance (Feet): 80 Feet Assistive device: Rolling walker Ambulation/Gait Assistance Details: verbal cues to look up/forward Gait Pattern: Step-to pattern;Antalgic Gait velocity: decreased    Exercises Total Joint Exercises Ankle Circles/Pumps: AROM;Both;10 reps Quad Sets: AROM;Right;10 reps Heel Slides: AAROM;Right;10 reps Hip ABduction/ADduction: AAROM;Right;10 reps Goniometric ROM: 0-50 R knee AAROM   PT Diagnosis:    PT Problem List:   PT Treatment Interventions:     PT Goals Acute Rehab PT Goals PT Goal: Supine/Side to Sit - Progress: Progressing toward goal PT Goal: Sit to Supine/Side - Progress: Progressing toward goal PT Goal: Sit to Stand - Progress: Progressing toward goal PT Goal: Stand to Sit - Progress:  Progressing toward goal PT Transfer Goal: Bed to Chair/Chair to Bed - Progress: Progressing toward goal PT Goal: Ambulate - Progress: Progressing toward goal PT Goal: Up/Down Stairs - Progress: Progressing toward goal PT Goal: Perform Home Exercise Program - Progress: Progressing toward goal  Visit Information  Last PT Received On: 04/20/12 Assistance Needed: +1    Subjective Data      Cognition  Overall Cognitive Status: Appears within functional limits for tasks assessed/performed Arousal/Alertness: Awake/alert Orientation Level: Oriented X4 / Intact Behavior During Session: [facility] for tasks performed    Balance     End of Session PT - End of Session Equipment Utilized During Treatment: Gait belt;Right knee immobilizer Activity Tolerance: Patient tolerated treatment well Patient left: in chair;with call bell/phone within reach;with family/visitor present    Ilda Foil 04/20/2012, 3:02 PM  Aida Raider, PT  Office # [phone] Pager [phone]

## 2012-04-21 ENCOUNTER — Encounter (HOSPITAL_COMMUNITY): Payer: Self-pay | Admitting: Orthopedic Surgery

## 2012-04-21 LAB — CBC
MCV: 87.2 fL (ref 78.0–100.0)
Platelets: 194 10*3/uL (ref 150–400)
RDW: 13.5 % (ref 11.5–15.5)
WBC: 10.5 10*3/uL (ref 4.0–10.5)

## 2012-04-21 MED ORDER — ENOXAPARIN SODIUM 30 MG/0.3ML ~~LOC~~ SOLN: 30.0000 mg | Freq: Two times a day (BID) | SUBCUTANEOUS | Status: DC

## 2012-04-21 MED ORDER — METHOCARBAMOL 500 MG PO TABS: 500.0000 mg | ORAL_TABLET | Freq: Four times a day (QID) | ORAL | Status: DC | PRN

## 2012-04-21 MED ORDER — OXYCODONE-ACETAMINOPHEN 5-325 MG PO TABS: ORAL_TABLET | ORAL | Status: DC

## 2012-04-21 NOTE — Progress Notes (Signed)
Physical Therapy Treatment Patient Details Name: Heidi Rhodes MRN: [id number] DOB: [birth year] Today's Date: 04/21/2012 Time: 9562-1308 PT Time Calculation (min): 33 min  PT Assessment / Plan / Recommendation Comments on Treatment Session  Pt continues to progress well with PT goals + mobility.  Initiated stair training this session- did well with stairs.  Husband present entire session    Follow Up Recommendations  Home health PT    Barriers to Discharge        Equipment Recommendations  None recommended by PT    Recommendations for Other Services    Frequency 7X/week   Plan Discharge plan remains appropriate;Frequency remains appropriate    Precautions / Restrictions Precautions Precautions: Knee Required Braces or Orthoses: Knee Immobilizer - Right Knee Immobilizer - Right: On when out of bed or walking Restrictions RLE Weight Bearing: Weight bearing as tolerated   Pertinent Vitals/Pain 4/10 knee at end of session.  Premedicated.     Mobility  Bed Mobility Bed Mobility: Supine to Sit Supine to Sit: HOB flat;4: Min assist Details for Bed Mobility Assistance: (A) to lift shoulders/trunk to sitting upright.  Cues for sequencing, UE use to increase ease of transition, & technique.  Transfers Transfers: Sit to Stand;Stand to Sit Sit to Stand: 4: Min guard;With upper extremity assist;From bed;Other (comment) (ortho gym mat table) Stand to Sit: 4: Min guard;With upper extremity assist;To chair/3-in-1;Other (comment) (ortho gym mat table) Details for Transfer Assistance: cues for safest hand placement & technique.  Ambulation/Gait Ambulation/Gait Assistance: 5: Supervision Ambulation Distance (Feet): 80 Feet Assistive device: Rolling walker Ambulation/Gait Assistance Details: Continues to require cues to look up.  Encouragement to decrease reliance of UE's on RW & increase weight bearing through RLE.   Gait Pattern: Step-to pattern;Decreased stance time - right;Decreased  step length - left Stairs: Yes Stairs Assistance: 4: Min guard Stair Management Technique: Two rails;Forwards;Step to pattern Number of Stairs: 2  (2x's) Wheelchair Mobility Wheelchair Mobility: No    Exercises     PT Diagnosis:    PT Problem List:   PT Treatment Interventions:     PT Goals Acute Rehab PT Goals Time For Goal Achievement: 04/26/12 Potential to Achieve Goals: Good PT Goal: Supine/Side to Sit - Progress: Not met PT Goal: Sit to Stand - Progress: Progressing toward goal PT Goal: Stand to Sit - Progress: Progressing toward goal PT Goal: Ambulate - Progress: Progressing toward goal PT Goal: Up/Down Stairs - Progress: Progressing toward goal  Visit Information  Last PT Received On: 04/21/12 Assistance Needed: +1    Subjective Data      Cognition  Overall Cognitive Status: Appears within functional limits for tasks assessed/performed Arousal/Alertness: Awake/alert Orientation Level: Oriented X4 / Intact Behavior During Session: [facility] for tasks performed    Balance  Balance Balance Assessed: No  End of Session PT - End of Session Equipment Utilized During Treatment: Gait belt;Right knee immobilizer Activity Tolerance: Patient tolerated treatment well Patient left: in chair;with call bell/phone within reach;with family/visitor present Nurse Communication: Mobility status    Lara Mulch 04/21/2012, 2:37 PM  Verdell Face, PTA [phone] 04/21/2012

## 2012-04-21 NOTE — Progress Notes (Signed)
Late Entry: Referral received for SNF.  Patient's was d/c home with Home Health over weekend.  Weekend SW staff checked her chart and determined that patient did not require SNF placement.  SW signed off.  Lorri Frederick. Jaci Lazier,  [place]  [phone]

## 2012-04-21 NOTE — Progress Notes (Signed)
CARE MANAGEMENT NOTE  04/21/2012   Patient:  Heidi Rhodes, Heidi Rhodes   Account Number:  [id number]  Date Initiated:  04/18/2012  Documentation initiated by:  Ronny Flurry  Subjective/Objective Assessment:   rt total knee     Action/Plan:   Spoke with patient and husband.Choice offered. preoperatively setup with [facility], no changes. CPM to be delivered to the home.   Anticipated DC Date:  04/21/2012   Anticipated DC Plan:  HOME W HOME HEALTH SERVICES      DC Planning Services  CM consult      [facility] Choice  HOME HEALTH   Choice offered to / List presented to:  C-1 Patient        HH arranged  HH-2 PT      [facility] agency  [facility]   Status of service:  Completed, signed off Medicare Important Message given?   (If response is "NO", the following Medicare IM given date fields will be blank) Date Medicare IM given:   Date Additional Medicare IM given:    Discharge Disposition:  HOME W HOME HEALTH SERVICES  Per UR Regulation:  Reviewed for med. necessity/level of care/duration of stay  If discussed at Long Length of Stay Meetings, dates discussed:    Comments:

## 2012-04-21 NOTE — Progress Notes (Signed)
Pt's husband demonstrated injection of lovenox sq. Without difficulty.  Verbalized understanding of med, side effects. Durenda Age [place]

## 2012-04-21 NOTE — Progress Notes (Signed)
Subjective: 3 Days Post-Op Procedure(s) (LRB): TOTAL KNEE ARTHROPLASTY (Right) Patient reports pain as mild.    Objective: Vital signs in last 24 hours: Temp:  [98.4 F (36.9 C)-100.3 F (37.9 C)] 100.3 F (37.9 C) (06/10 0624) Pulse Rate:  [88-100] 88  (06/10 0624) Resp:  [18] 18  (06/10 0624) BP: (126-157)/(72-79) 126/72 mmHg (06/10 0624) SpO2:  [93 %-96 %] 95 % (06/10 0624)  Intake/Output from previous day: 06/09 0701 - 06/10 0700 In: 1465 [P.O.:1440] Out: -  Intake/Output this shift: Total I/O In: 480 [P.O.:480] Out: -    Basename 04/21/12 0615 04/20/12 0605 04/19/12 0535  HGB 11.8* 10.9* 12.3    Basename 04/21/12 0615 04/20/12 0605  WBC 10.5 8.0  RBC 3.99 3.69*  HCT 34.8* 32.5*  PLT 194 159    Basename 04/19/12 0535  NA 137  K 4.3  CL 100  CO2 26  BUN 15  CREATININE 0.59  GLUCOSE 128*  CALCIUM 8.9   No results found for this basename: LABPT:2,INR:2 in the last 72 hours  Neurovascular intact Sensation intact distally Intact pulses distally Dorsiflexion/Plantar flexion intact Incision: dressing C/D/I  Assessment/Plan: 3 Days Post-Op Procedure(s) (LRB): TOTAL KNEE ARTHROPLASTY (Right) Discharge home with home health  Margart Sickles 04/21/2012, 9:17 AM

## 2012-04-21 NOTE — Discharge Instructions (Signed)
Diet: As you were doing prior to hospitalization  Activity:  Increase activity slowly as tolerated                  No lifting or driving for 6 weeks  Shower:  May shower without a dressing starting Wed, NO tub SOAKING  Dressing:  You may change your dressing daily  Weight Bearing:   weight bearing as taught in physical therapy.  Use a walker or                    Crutches as instructed.  To prevent constipation: you may use a stool softener such as -               Colace ( over the counter) 100 mg by mouth twice a day                Drink plenty of fluids ( prune juice may be helpful) and high fiber foods                Miralax ( over the counter) for constipation as needed.    Precautions:  If you experience chest pain or shortness of breath - call 911 immediately               For transfer to the hospital emergency department!!               If you develop a fever greater that 101 F, purulent drainage from wound,                             increased redness or drainage from wound, or calf pain -- Call the office at                                                 [phone].  Follow- Up Appointment:  Please call for an appointment to be seen               [facility] - [phone]               [facility] - [phone]               [place] - [phone]    Home Health to be provided by [facility] [phone]

## 2012-04-21 NOTE — Discharge Summary (Signed)
[address] [place], [place], [place]  [address]                             [id number]  PATIENT ID: Heidi Rhodes        MRN:  [id number]          DOB/AGE: 12/24/1950 / 61 y.o.    DISCHARGE SUMMARY  ADMISSION DATE:    04/18/2012 DISCHARGE DATE:   04/21/2012   ADMISSION DIAGNOSIS: osteoarthritis right knee    DISCHARGE DIAGNOSIS:  osteoarthritis right knee    ADDITIONAL DIAGNOSIS: Active Problems:  * No active hospital problems. *   Past Medical History  Diagnosis Date  . HTN (hypertension)   . Arthritis   . Gallbladder disorder   . Anxiety     about being put to sleep for  surgery    PROCEDURE: Procedure(s): TOTAL KNEE ARTHROPLASTY on 04/18/2012  CONSULTS:   PT/OT  HISTORY:  See H&P in chart  HOSPITAL COURSE:  Heidi Rhodes is a 61 y.o. admitted on 04/18/2012 and found to have a diagnosis of osteoarthritis right knee.  After appropriate laboratory studies were obtained  they were taken to the operating room on 04/18/2012 and underwent Procedure(s): TOTAL KNEE ARTHROPLASTY.   They were given perioperative antibiotics:  Anti-infectives     Start     Dose/Rate Route Frequency Ordered Stop   04/18/12 1700   ceFAZolin (ANCEF) IVPB 2 g/50 mL premix        2 g 100 mL/hr over 30 Minutes Intravenous Every 6 hours 04/18/12 1553 04/18/12 2230   04/17/12 1420   ceFAZolin (ANCEF) IVPB 2 g/50 mL premix        2 g 100 mL/hr over 30 Minutes Intravenous 60 min pre-op 04/17/12 1420 04/18/12 1055        .  Tolerated the procedure well.  Placed with a foley intraoperatively.  Given Ofirmev at induction and for 24 hours.    POD #1, allowed out of bed to a chair.  PT for ambulation and exercise program.  Foley D/C'd in morning.  IV saline locked.  O2 discontionued.  POD #2, continued PT and ambulation.   Hemovac pulled. .  The remainder of the hospital course was dedicated to ambulation and strengthening.   The patient was discharged on 3 Days Post-Op in  Stable  condition.  Blood products given:none  DIAGNOSTIC STUDIES: Recent vital signs: Patient Vitals for the past 24 hrs:  BP Temp Temp src Pulse Resp SpO2  04/21/12 0624 126/72 mmHg 100.3 F (37.9 C) - 88  18  95 %  04-28-2012 2250 153/79 mmHg 98.4 F (36.9 C) - 90  18  96 %  04/28/2012 1500 157/75 mmHg 100 F (37.8 C) Oral 100  18  93 %  04/28/12 1200 - - - - 18  96 %       Recent laboratory studies:  Basename 04/21/12 0615 28-Apr-2012 0605 04/19/12 0535  WBC 10.5 8.0 10.8*  HGB 11.8* 10.9* 12.3  HCT 34.8* 32.5* 36.7  PLT 194 159 212    Basename 04/19/12 0535  NA 137  K 4.3  CL 100  CO2 26  BUN 15  CREATININE 0.59  GLUCOSE 128*  CALCIUM 8.9   Lab Results  Component Value Date   INR 0.99 04/11/2012   INR 0.98 04/13/2011     Recent Radiographic Studies :  Dg Chest 2 View  04/11/2012  *  RADIOLOGY REPORT*  Clinical Data: Preop for right knee replacement  CHEST - 2 VIEW  Comparison: Chest x-ray of 02/15/2011  Findings: The lungs are clear.  Mediastinal contours appear normal. The heart is mildly enlarged and stable.  No acute bony abnormality is seen.  IMPRESSION: Stable mild cardiomegaly.  No active lung disease.  Original Report Authenticated By: Juline Patch, M.D.   X-ray Knee Right Port  04/18/2012  *RADIOLOGY REPORT*  Clinical Data: Postoperative arthroplasty  PORTABLE RIGHT KNEE - 1-2 VIEW  Comparison: None.  Findings: Right total knee arthroplasty components appear well seated.  Surgical drain with tip in the lateral aspect of the joint.  Skin staples noted.  IMPRESSION: Right knee total arthroplasty without complication.  Original Report Authenticated By: Genevive Bi, M.D.    DISCHARGE INSTRUCTIONS: Discharge Orders    Future Appointments: Provider: Department: Dept Phone: Center:   04/29/2012 11:00 AM [facility]  None      DISCHARGE MEDICATIONS:   Medication List  As of 04/21/2012  8:16 AM   ASK your doctor about these medications         amLODipine  5 MG tablet   Commonly known as: NORVASC   Take 5 mg by mouth daily.      hydrochlorothiazide 25 MG tablet   Commonly known as: HYDRODIURIL   Take 25 mg by mouth daily.      hyoscyamine 0.125 MG SL tablet   Commonly known as: LEVSIN SL   Place 0.125 mg under the tongue every 4 (four) hours as needed. As needed for stomach cramping.      multivitamin with minerals Tabs   Take 1 tablet by mouth daily. womens One-A-Day 50+ tabs            FOLLOW UP VISIT:   Follow-up Information    Call CAFFREY JR,W D, MD. (to make an appt to be seen 2 weeks following surgery)    Contact information:   [facility], [facility] [address] [place] [address] [phone]          DISPOSITION:  Home  With [facility]  CONDITION:  Stable   Margart Sickles 04/21/2012, 8:16 AM

## 2012-04-23 ENCOUNTER — Encounter (HOSPITAL_COMMUNITY): Payer: Self-pay | Admitting: Pharmacy Technician

## 2012-04-29 ENCOUNTER — Encounter (HOSPITAL_COMMUNITY): Payer: BC Managed Care – PPO

## 2012-05-05 ENCOUNTER — Encounter (HOSPITAL_COMMUNITY): Admission: RE | Payer: Self-pay | Source: Ambulatory Visit

## 2012-05-05 ENCOUNTER — Ambulatory Visit (HOSPITAL_COMMUNITY): Admission: RE | Admit: 2012-05-05 | Payer: BC Managed Care – PPO | Source: Ambulatory Visit | Admitting: General Surgery

## 2012-05-05 SURGERY — LAPAROSCOPIC CHOLECYSTECTOMY
Anesthesia: Choice

## 2012-06-05 IMAGING — CR DG CHEST 2V
2 series · 2 of 2 positions shown · non-contrast
Comparison: Chest x-ray of 02/15/2011

CLINICAL DATA: Preop for right knee replacement

CHEST - 2 VIEW

[view not recorded (1 of 2)]
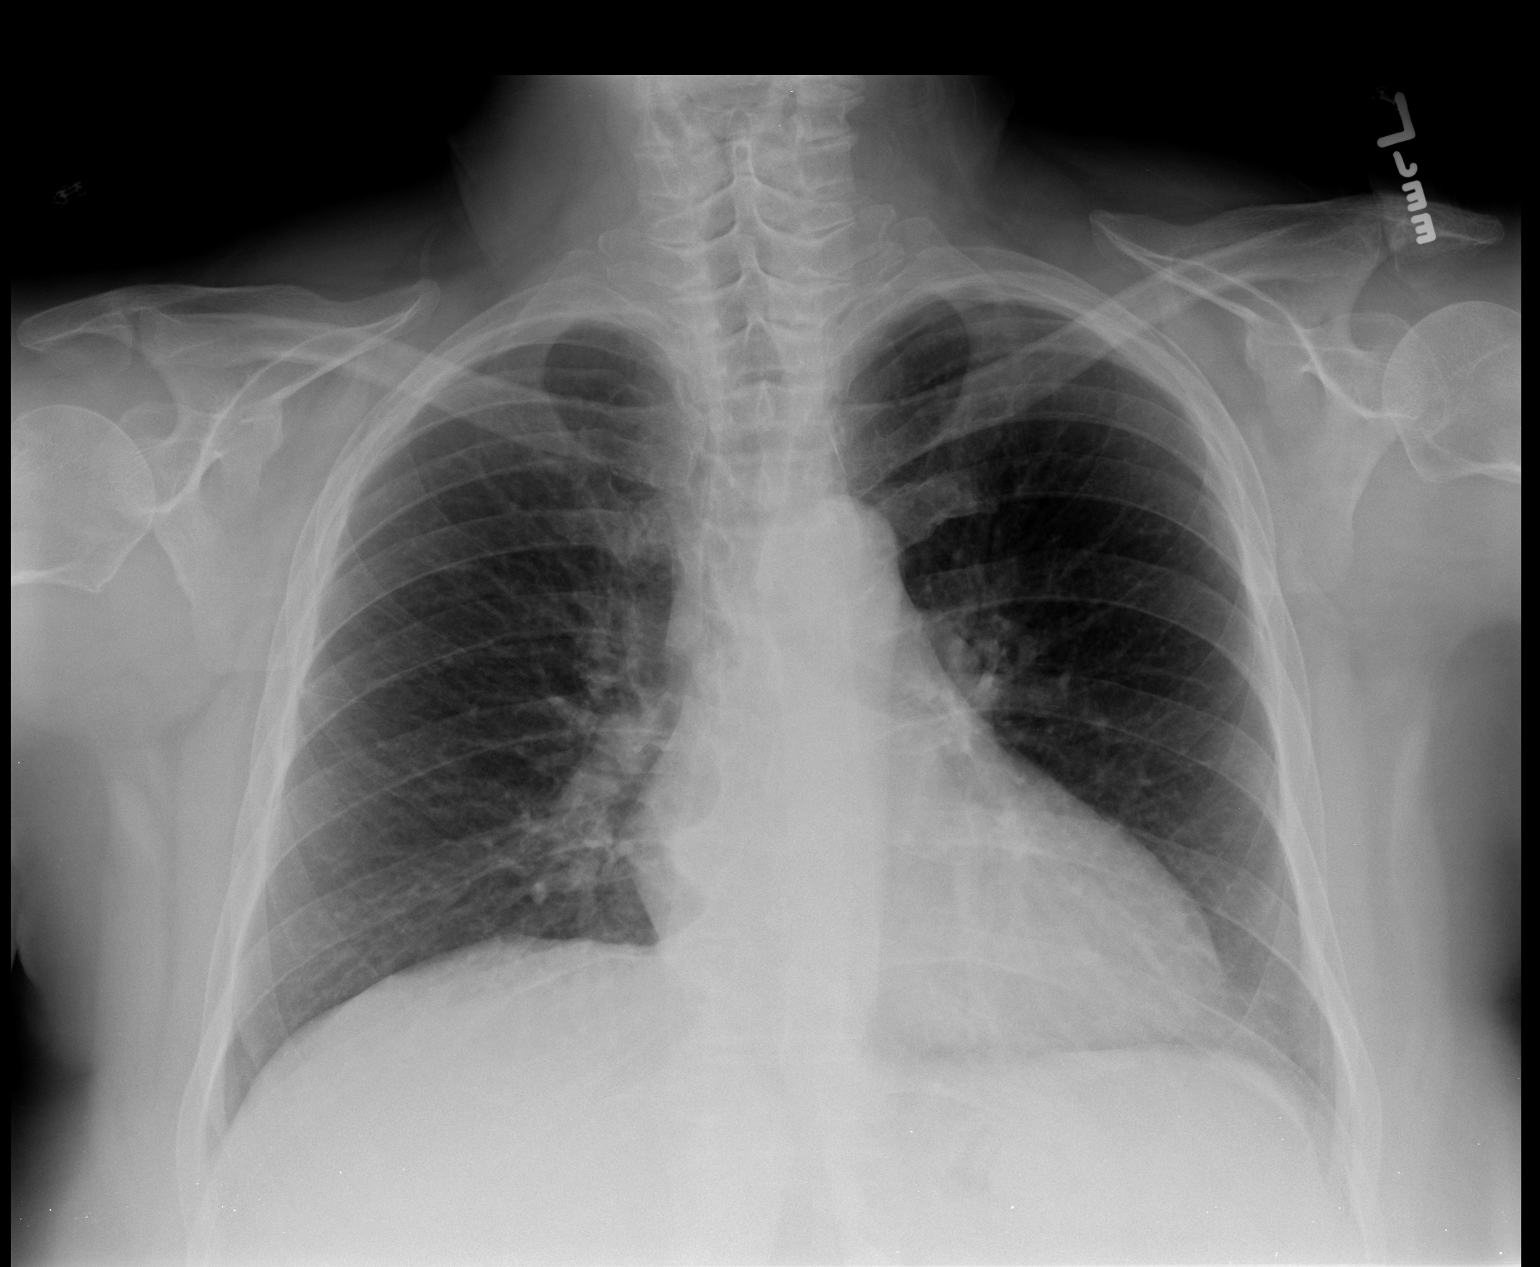

[view not recorded (2 of 2)]
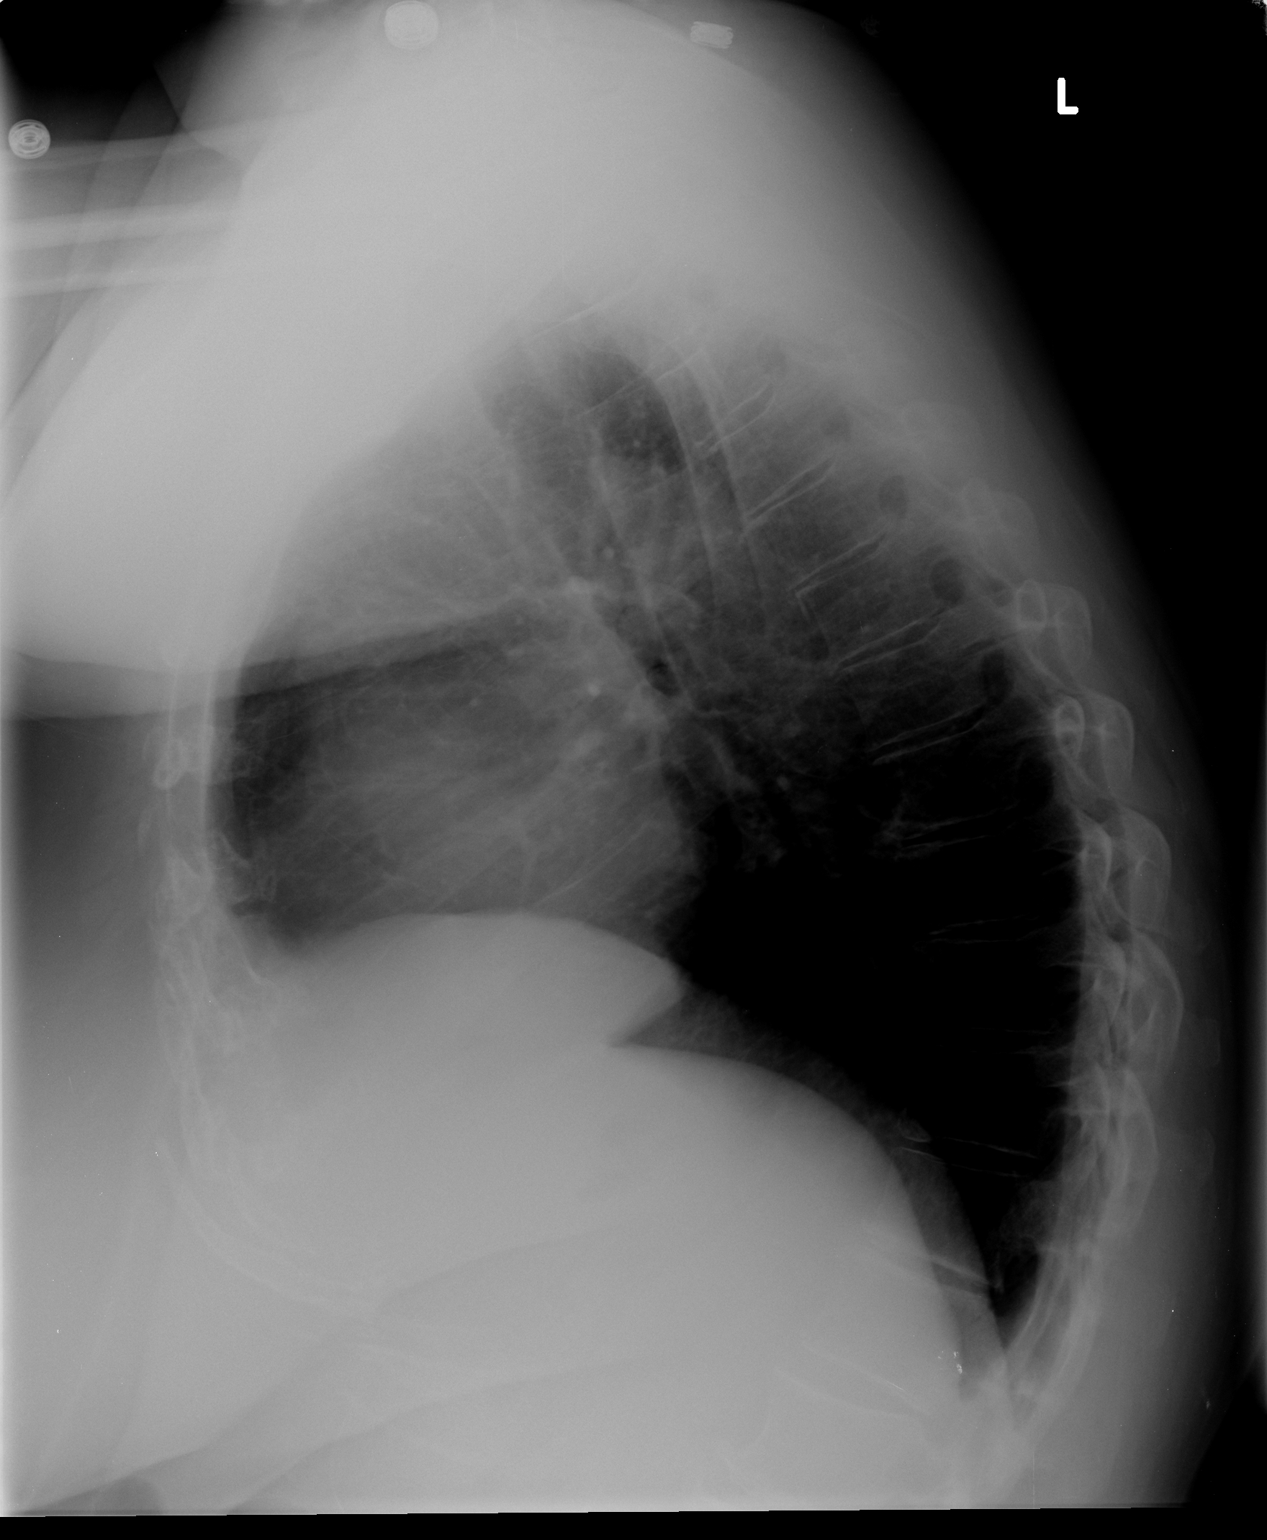

[2 of 2 positions shown; findings below may reference images not displayed]

FINDINGS: The lungs are clear.  Mediastinal contours appear normal.
The heart is mildly enlarged and stable.  No acute bony abnormality
is seen.
IMPRESSION: Stable mild cardiomegaly.  No active lung disease.

## 2012-06-06 ENCOUNTER — Encounter (HOSPITAL_COMMUNITY): Payer: Self-pay | Admitting: Pharmacy Technician

## 2012-06-09 ENCOUNTER — Ambulatory Visit (HOSPITAL_COMMUNITY)
Admission: RE | Admit: 2012-06-09 | Discharge: 2012-06-09 | Disposition: A | Discharge status: Home / Self Care | Payer: BC Managed Care – PPO | Source: Ambulatory Visit | Attending: General Surgery | Admitting: General Surgery

## 2012-06-09 ENCOUNTER — Encounter (HOSPITAL_COMMUNITY): Payer: Self-pay

## 2012-06-09 ENCOUNTER — Other Ambulatory Visit (HOSPITAL_COMMUNITY): Payer: Self-pay | Admitting: General Surgery

## 2012-06-09 DIAGNOSIS — R109 Unspecified abdominal pain: Secondary | ICD-10-CM

## 2012-06-09 DIAGNOSIS — Z01812 Encounter for preprocedural laboratory examination: Secondary | ICD-10-CM | POA: Insufficient documentation

## 2012-06-09 DIAGNOSIS — K8020 Calculus of gallbladder without cholecystitis without obstruction: Secondary | ICD-10-CM | POA: Insufficient documentation

## 2012-06-09 DIAGNOSIS — Z8719 Personal history of other diseases of the digestive system: Secondary | ICD-10-CM

## 2012-06-09 LAB — CBC WITH DIFFERENTIAL/PLATELET
Basophils Absolute: 0 10*3/uL (ref 0.0–0.1)
HCT: 43.5 % (ref 36.0–46.0)
Lymphocytes Relative: 24 % (ref 12–46)
Monocytes Absolute: 0.6 10*3/uL (ref 0.1–1.0)
Neutro Abs: 4.4 10*3/uL (ref 1.7–7.7)
Platelets: 256 10*3/uL (ref 150–400)
RBC: 4.96 MIL/uL (ref 3.87–5.11)
RDW: 13.2 % (ref 11.5–15.5)
WBC: 6.8 10*3/uL (ref 4.0–10.5)

## 2012-06-09 LAB — BASIC METABOLIC PANEL
Chloride: 108 mEq/L (ref 96–112)
GFR calc Af Amer: >90 mL/min (ref >90)
Potassium: 4.2 mEq/L (ref 3.5–5.1)

## 2012-06-09 LAB — HEPATIC FUNCTION PANEL
AST: 22 U/L (ref 0–37)
Bilirubin, Direct: <0.1 mg/dL (ref 0.0–0.3)
Total Protein: 7.4 g/dL (ref 6.0–8.3)

## 2012-06-09 LAB — SURGICAL PCR SCREEN: Staphylococcus aureus: NEGATIVE

## 2012-06-09 LAB — AMYLASE: Amylase: 52 U/L (ref 0–105)

## 2012-06-09 NOTE — Patient Instructions (Addendum)
20 JOSHUA ZERINGUE  06/09/2012   Your procedure is scheduled on:  06/16/2012  Report to [facility] at  900  AM.  Call this number if you have problems the morning of surgery: [phone]   Remember:   Do not eat food:After Midnight.  May have clear liquids:until Midnight .    Take these medicines the morning of surgery with A SIP OF WATER:  Norvasc,hydrocholothiazide,levsin,percocet   Do not wear jewelry, make-up or nail polish.  Do not wear lotions, powders, or perfumes. You may wear deodorant.  Do not shave 48 hours prior to surgery. Men may shave face and neck.  Do not bring valuables to the hospital.  Contacts, dentures or bridgework may not be worn into surgery.  Leave suitcase in the car. After surgery it may be brought to your room.  For patients admitted to the hospital, checkout time is 11:00 AM the day of discharge.   Patients discharged the day of surgery will not be allowed to drive home.  Name and phone number of your driver: family  Special Instructions: CHG Shower Use Special Wash: 1/2 bottle night before surgery and 1/2 bottle morning of surgery.   Please read over the following fact sheets that you were given: Pain Booklet, MRSA Information, Surgical Site Infection Prevention, Anesthesia Post-op Instructions and Care and Recovery After Surgery PATIENT INSTRUCTIONS POST-ANESTHESIA  IMMEDIATELY FOLLOWING SURGERY:  Do not drive or operate machinery for the first twenty four hours after surgery.  Do not make any important decisions for twenty four hours after surgery or while taking narcotic pain medications or sedatives.  If you develop intractable nausea and vomiting or a severe headache please notify your doctor immediately.  FOLLOW-UP:  Please make an appointment with your surgeon as instructed. You do not need to follow up with anesthesia unless specifically instructed to do so.  WOUND CARE INSTRUCTIONS (if applicable):  Keep a dry clean dressing on the  anesthesia/puncture wound site if there is drainage.  Once the wound has quit draining you may leave it open to air.  Generally you should leave the bandage intact for twenty four hours unless there is drainage.  If the epidural site drains for more than 36-48 hours please call the anesthesia department.  QUESTIONS?:  Please feel free to call your physician or the hospital operator if you have any questions, and they will be happy to assist you.      Laparoscopic Cholecystectomy Laparoscopic cholecystectomy is surgery to remove the gallbladder. The gallbladder is located slightly to the right of center in the abdomen, behind the liver. It is a concentrating and storage sac for the bile produced in the liver. Bile aids in the digestion and absorption of fats. Gallbladder disease (cholecystitis) is an inflammation of your gallbladder. This condition is usually caused by a buildup of gallstones (cholelithiasis) in your gallbladder. Gallstones can block the flow of bile, resulting in inflammation and pain. In severe cases, emergency surgery may be required. When emergency surgery is not required, you will have time to prepare for the procedure. Laparoscopic surgery is an alternative to open surgery. Laparoscopic surgery usually has a shorter recovery time. Your common bile duct may also need to be examined and explored. Your caregiver will discuss this with you if he or she feels this should be done. If stones are found in the common bile duct, they may be removed. LET YOUR CAREGIVER KNOW ABOUT:  Allergies to food or medicine.   Medicines  taken, including vitamins, herbs, eyedrops, over-the-counter medicines, and creams.   Use of steroids (by mouth or creams).   Previous problems with anesthetics or numbing medicines.   History of bleeding problems or blood clots.   Previous surgery.   Other health problems, including diabetes and kidney problems.   Possibility of pregnancy, if this applies.    RISKS AND COMPLICATIONS All surgery is associated with risks. Some problems that may occur following this procedure include:  Infection.   Damage to the common bile duct, nerves, arteries, veins, or other internal organs such as the stomach or intestines.   Bleeding.   A stone may remain in the common bile duct.  BEFORE THE PROCEDURE  Do not take aspirin for 3 days prior to surgery or blood thinners for 1 week prior to surgery.   Do not eat or drink anything after midnight the night before surgery.   Let your caregiver know if you develop a cold or other infectious problem prior to surgery.   You should be present 60 minutes before the procedure or as directed.  PROCEDURE  You will be given medicine that makes you sleep (general anesthetic). When you are asleep, your surgeon will make several small cuts (incisions) in your abdomen. One of these incisions is used to insert a small, lighted scope (laparoscope) into the abdomen. The laparoscope helps the surgeon see into your abdomen. Carbon dioxide gas will be pumped into your abdomen. The gas allows more room for the surgeon to perform your surgery. Other operating instruments are inserted through the other incisions. Laparoscopic procedures may not be appropriate when:  There is major scarring from previous surgery.   The gallbladder is extremely inflamed.   There are bleeding disorders or unexpected cirrhosis of the liver.   A pregnancy is near term.   Other conditions make the laparoscopic procedure impossible.  If your surgeon feels it is not safe to continue with a laparoscopic procedure, he or she will perform an open abdominal procedure. In this case, the surgeon will make an incision to open the abdomen. This gives the surgeon a larger view and field to work within. This may allow the surgeon to perform procedures that sometimes cannot be performed with a laparoscope alone. Open surgery has a longer recovery time. AFTER THE  PROCEDURE  You will be taken to the recovery area where a nurse will watch and check your progress.   You may be allowed to go home the same day.   Do not resume physical activities until directed by your caregiver.   You may resume a normal diet and activities as directed.  Document Released: 10/29/2005 Document Revised: 10/18/2011 Document Reviewed: 04/13/2011 [facility] Patient Information 2012 [place], [place].

## 2012-06-09 NOTE — Consult Note (Signed)
NAME:  Heidi Rhodes, Heidi Rhodes NO.:  [id number]  MEDICAL RECORD NO.:  [id number]  LOCATION:  DOIB                          FACILITY:  [facility]  PHYSICIAN:  Barbaraann Barthel, M.D. DATE OF BIRTH:  04-15-1951  DATE OF CONSULTATION:  06/09/2012 DATE OF DISCHARGE:                                CONSULTATION   NOTE:  Surgery was asked to see this 61 year old white female for gallbladder disease.  She was worked up in January essentially for right upper quadrant postprandial pain with nausea and a sonogram revealed multiple small stones.  She also had a nonfunctioning gallbladder on hepatobiliary scan without an ejection fraction noted.  She appeared to have symptoms of chronic cholecystitis by hepatobiliary scan.  At any rate, she had multiple stones noted by sonogram in January.  We will repeat the sonogram and she continues to have some postprandial discomfort.  We visualized the gallbladder however.  PHYSICAL EXAMINATION:  VITAL SIGNS:  She is 5 feet 8 inches, 265 pounds, temperature is 97.6, pulse rate is 96, respirations 12, blood pressure 140/82. 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 HEENT:  Head is normocephalic.  Eyes, extraocular movements are intact. Pupils are round and reactive to light and accommodation.  There is no conjunctive pallor or scleral injection.  The patient wears corrective lenses. NECK:  She has no cervical adenopathy.  No jugular vein distention, thyromegaly, tracheal deviation, or cervical adenopathy. CHEST:  Clear both anterior and posterior auscultation. HEART:  Regular rhythm. BREASTS AND AXILLA:  Without masses. ABDOMEN:  Soft.  She has no guarding or rebound tenderness.  She has a midline incision consistent with the previous C-sections.  No obvious hernias are appreciated.  No femoral or inguinal hernias. RECTAL:  Deferred.  She has had fairly recent in June total knee replacement on the right side and then she had  a left knee replacement in June 2012.  REVIEW OF SYSTEMS:  NEURO:  No history of migraines or seizures. ENDOCRINE:  No history of diabetes or thyroid disease.  CARDIOPULMONARY: Hypertension.  No history of chest pain.  She is a nonsmoker nondrinker. MUSCULOSKELETAL:  The patient is Obese.  She has had bilateral knee replacement surgeries.  OB/GYN:  She is a gravida 4, cesarean 3, abortus 1 female with no family history of carcinoma of the breast.  She had a last mammogram of this year, she has reported this as negative.  She had a tubal ligation in 1983.  GI:  No history of hepatitis, constipation, diarrhea, bright red rectal bleeding, melena or inflammatory bowel disease or unexplained weight loss.  She had a colonoscopy done in 2013.  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  GU:  No history of hematuria, dysuria, or history of nephrolithiasis.  MEDICATIONS:  The patient takes hydrochlorothiazide and takes Norvasc for her hypertension.  She also takes multivitamin and a muscle relaxant and she was taking some oxycodone for her legs and she takes hyoscyamine 0.125 mg as needed.  She has no known allergies to any medications.  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 We will plan for a laparoscopic cholecystectomy via the outpatient department.  I have discussed complications not limited to, but including bleeding, infection, damage to bile ducts, perforation of organs, and transitory diarrhea.  I also discussed the possibility of open surgery might be required.  Informed consent was obtained.     Barbaraann Barthel, M.D.     WB/MEDQ  D:  06/09/2012  T:  06/09/2012  Job:  [id number]  cc:   Corrie Mckusick, M.D. Fax: [phone]

## 2012-06-09 NOTE — Progress Notes (Signed)
06/09/12 1204  OBSTRUCTIVE SLEEP APNEA  Have you ever been diagnosed with sleep apnea through a sleep study? No  Do you snore loudly (loud enough to be heard through closed doors)?  1  Do you often feel tired, fatigued, or sleepy during the daytime? 0  Has anyone observed you stop breathing during your sleep? 0  Do you have, or are you being treated for high blood pressure? 1  BMI more than 35 kg/m2? 1  Age over 61 years old? 1  Neck circumference greater than 40 cm/18 inches? 0  Gender: 0  Obstructive Sleep Apnea Score 4   Score 4 or greater  Updated health history;Results sent to PCP

## 2012-06-10 ENCOUNTER — Ambulatory Visit (HOSPITAL_COMMUNITY)
Admission: RE | Admit: 2012-06-10 | Discharge: 2012-06-10 | Disposition: A | Discharge status: Home / Self Care | Payer: BC Managed Care – PPO | Source: Ambulatory Visit | Attending: General Surgery | Admitting: General Surgery

## 2012-06-10 DIAGNOSIS — R109 Unspecified abdominal pain: Secondary | ICD-10-CM | POA: Insufficient documentation

## 2012-06-10 DIAGNOSIS — K769 Liver disease, unspecified: Secondary | ICD-10-CM | POA: Insufficient documentation

## 2012-06-10 DIAGNOSIS — K8020 Calculus of gallbladder without cholecystitis without obstruction: Secondary | ICD-10-CM | POA: Insufficient documentation

## 2012-06-10 DIAGNOSIS — R9389 Abnormal findings on diagnostic imaging of other specified body structures: Secondary | ICD-10-CM | POA: Insufficient documentation

## 2012-06-10 DIAGNOSIS — Z8719 Personal history of other diseases of the digestive system: Secondary | ICD-10-CM

## 2012-06-16 ENCOUNTER — Ambulatory Visit (HOSPITAL_COMMUNITY): Payer: BC Managed Care – PPO | Admitting: Anesthesiology

## 2012-06-16 ENCOUNTER — Encounter (HOSPITAL_COMMUNITY): Payer: Self-pay | Admitting: Anesthesiology

## 2012-06-16 ENCOUNTER — Encounter (HOSPITAL_COMMUNITY): Payer: Self-pay | Admitting: *Deleted

## 2012-06-16 ENCOUNTER — Encounter (HOSPITAL_COMMUNITY)
Admission: RE | Disposition: A | Discharge status: Home / Self Care | Payer: Self-pay | Source: Ambulatory Visit | Attending: General Surgery

## 2012-06-16 ENCOUNTER — Ambulatory Visit (HOSPITAL_COMMUNITY)
Admission: RE | Admit: 2012-06-16 | Discharge: 2012-06-17 | Disposition: A | Discharge status: Home / Self Care | Payer: BC Managed Care – PPO | Source: Ambulatory Visit | Attending: General Surgery | Admitting: General Surgery

## 2012-06-16 DIAGNOSIS — Z01812 Encounter for preprocedural laboratory examination: Secondary | ICD-10-CM | POA: Insufficient documentation

## 2012-06-16 DIAGNOSIS — K8010 Calculus of gallbladder with chronic cholecystitis without obstruction: Secondary | ICD-10-CM | POA: Insufficient documentation

## 2012-06-16 DIAGNOSIS — E669 Obesity, unspecified: Secondary | ICD-10-CM | POA: Insufficient documentation

## 2012-06-16 DIAGNOSIS — Z6838 Body mass index (BMI) 38.0-38.9, adult: Secondary | ICD-10-CM | POA: Insufficient documentation

## 2012-06-16 DIAGNOSIS — M1990 Unspecified osteoarthritis, unspecified site: Secondary | ICD-10-CM

## 2012-06-16 DIAGNOSIS — I10 Essential (primary) hypertension: Secondary | ICD-10-CM | POA: Insufficient documentation

## 2012-06-16 DIAGNOSIS — F411 Generalized anxiety disorder: Secondary | ICD-10-CM | POA: Insufficient documentation

## 2012-06-16 HISTORY — PX: CHOLECYSTECTOMY: SHX55

## 2012-06-16 SURGERY — LAPAROSCOPIC CHOLECYSTECTOMY
Anesthesia: General | Site: Abdomen | Wound class: Clean Contaminated

## 2012-06-16 MED ORDER — FENTANYL CITRATE 0.05 MG/ML IJ SOLN
INTRAMUSCULAR | Status: DC | PRN
  Administered 2012-06-16: 100 ug via INTRAVENOUS
  Administered 2012-06-16 (×3): 50 ug via INTRAVENOUS

## 2012-06-16 MED ORDER — FENTANYL CITRATE 0.05 MG/ML IJ SOLN
INTRAMUSCULAR | Status: AC
  Filled 2012-06-16: qty 5

## 2012-06-16 MED ORDER — GLYCOPYRROLATE 0.2 MG/ML IJ SOLN
0.2000 mg | Freq: Once | INTRAMUSCULAR | Status: AC
  Administered 2012-06-16: 0.2 mg via INTRAVENOUS

## 2012-06-16 MED ORDER — MORPHINE SULFATE 2 MG/ML IJ SOLN: 1.0000 mg | INTRAMUSCULAR | Status: DC | PRN

## 2012-06-16 MED ORDER — MIDAZOLAM HCL 2 MG/2ML IJ SOLN
1.0000 mg | INTRAMUSCULAR | Status: DC | PRN
  Administered 2012-06-16 (×3): 2 mg via INTRAVENOUS

## 2012-06-16 MED ORDER — PROPOFOL 10 MG/ML IV EMUL
INTRAVENOUS | Status: AC
  Filled 2012-06-16: qty 20

## 2012-06-16 MED ORDER — NEOSTIGMINE METHYLSULFATE 1 MG/ML IJ SOLN
INTRAMUSCULAR | Status: DC | PRN
  Administered 2012-06-16: 3 mg via INTRAVENOUS

## 2012-06-16 MED ORDER — HEMOSTATIC AGENTS (NO CHARGE) OPTIME
TOPICAL | Status: DC | PRN
  Administered 2012-06-16: 2 via TOPICAL

## 2012-06-16 MED ORDER — LIDOCAINE HCL 1 % IJ SOLN
INTRAMUSCULAR | Status: DC | PRN
  Administered 2012-06-16: 50 mg via INTRADERMAL

## 2012-06-16 MED ORDER — WATER FOR IRRIGATION, STERILE IR SOLN
Status: DC | PRN
  Administered 2012-06-16: 2000 mL

## 2012-06-16 MED ORDER — MIDAZOLAM HCL 2 MG/2ML IJ SOLN
INTRAMUSCULAR | Status: AC
  Filled 2012-06-16: qty 2

## 2012-06-16 MED ORDER — GLYCOPYRROLATE 0.2 MG/ML IJ SOLN
INTRAMUSCULAR | Status: AC
  Filled 2012-06-16: qty 2

## 2012-06-16 MED ORDER — DEXTROSE 5 % IV SOLN: 2.0000 g | INTRAVENOUS | Status: DC

## 2012-06-16 MED ORDER — DEXTROSE 5 % IV SOLN
2.0000 g | INTRAVENOUS | Status: DC | PRN
  Administered 2012-06-16: 2 g via INTRAVENOUS

## 2012-06-16 MED ORDER — LACTATED RINGERS IV SOLN
INTRAVENOUS | Status: DC
  Administered 2012-06-16: 09:00:00 via INTRAVENOUS

## 2012-06-16 MED ORDER — DEXTROSE 5 % IV SOLN: 2.0000 g | Freq: Once | INTRAVENOUS | Status: DC

## 2012-06-16 MED ORDER — DEXTROSE 5 % IV SOLN
INTRAVENOUS | Status: AC
  Filled 2012-06-16: qty 10

## 2012-06-16 MED ORDER — DEXTROSE 5 % IV SOLN
INTRAVENOUS | Status: AC
  Filled 2012-06-16: qty 2

## 2012-06-16 MED ORDER — PROPOFOL 10 MG/ML IV BOLUS
INTRAVENOUS | Status: DC | PRN
  Administered 2012-06-16: 160 mg via INTRAVENOUS

## 2012-06-16 MED ORDER — ONDANSETRON HCL 4 MG/2ML IJ SOLN
4.0000 mg | Freq: Once | INTRAMUSCULAR | Status: AC
  Administered 2012-06-16: 4 mg via INTRAVENOUS

## 2012-06-16 MED ORDER — SODIUM CHLORIDE 0.9 % IR SOLN
Status: DC | PRN
  Administered 2012-06-16: 3000 mL

## 2012-06-16 MED ORDER — ONDANSETRON HCL 4 MG/2ML IJ SOLN: 4.0000 mg | Freq: Four times a day (QID) | INTRAMUSCULAR | Status: DC | PRN

## 2012-06-16 MED ORDER — SODIUM CHLORIDE 0.9 % IR SOLN
Status: DC | PRN
  Administered 2012-06-16: 1000 mL

## 2012-06-16 MED ORDER — FENTANYL CITRATE 0.05 MG/ML IJ SOLN: 25.0000 ug | INTRAMUSCULAR | Status: DC | PRN

## 2012-06-16 MED ORDER — FENTANYL CITRATE 0.05 MG/ML IJ SOLN
INTRAMUSCULAR | Status: AC
Start: 2012-06-16 — End: 2012-06-16
  Filled 2012-06-16: qty 2

## 2012-06-16 MED ORDER — ONDANSETRON HCL 4 MG/2ML IJ SOLN
INTRAMUSCULAR | Status: AC
  Filled 2012-06-16: qty 2

## 2012-06-16 MED ORDER — AMLODIPINE BESYLATE 5 MG PO TABS
5.0000 mg | ORAL_TABLET | Freq: Every day | ORAL | Status: DC
  Administered 2012-06-17: 5 mg via ORAL
  Filled 2012-06-16: qty 1

## 2012-06-16 MED ORDER — OXYCODONE-ACETAMINOPHEN 5-325 MG PO TABS
1.0000 | ORAL_TABLET | ORAL | Status: DC | PRN
  Administered 2012-06-16: 2 via ORAL
  Filled 2012-06-16: qty 2

## 2012-06-16 MED ORDER — BUPIVACAINE HCL (PF) 0.5 % IJ SOLN
INTRAMUSCULAR | Status: AC
  Filled 2012-06-16: qty 30

## 2012-06-16 MED ORDER — ACETAMINOPHEN 325 MG PO TABS: 650.0000 mg | ORAL_TABLET | ORAL | Status: DC | PRN

## 2012-06-16 MED ORDER — BUPIVACAINE HCL (PF) 0.5 % IJ SOLN
INTRAMUSCULAR | Status: DC | PRN
  Administered 2012-06-16: 13 mL

## 2012-06-16 MED ORDER — ROCURONIUM BROMIDE 100 MG/10ML IV SOLN
INTRAVENOUS | Status: DC | PRN
  Administered 2012-06-16: 10 mg via INTRAVENOUS
  Administered 2012-06-16: 40 mg via INTRAVENOUS

## 2012-06-16 MED ORDER — HYDROCHLOROTHIAZIDE 25 MG PO TABS
25.0000 mg | ORAL_TABLET | Freq: Every day | ORAL | Status: DC
  Administered 2012-06-17: 25 mg via ORAL
  Filled 2012-06-16: qty 1

## 2012-06-16 MED ORDER — ONDANSETRON HCL 4 MG PO TABS: 4.0000 mg | ORAL_TABLET | Freq: Four times a day (QID) | ORAL | Status: DC | PRN

## 2012-06-16 MED ORDER — ROCURONIUM BROMIDE 50 MG/5ML IV SOLN
INTRAVENOUS | Status: AC
  Filled 2012-06-16: qty 1

## 2012-06-16 MED ORDER — GLYCOPYRROLATE 0.2 MG/ML IJ SOLN
INTRAMUSCULAR | Status: DC | PRN
  Administered 2012-06-16: 0.4 mg via INTRAVENOUS

## 2012-06-16 MED ORDER — FENTANYL CITRATE 0.05 MG/ML IJ SOLN
INTRAMUSCULAR | Status: AC
  Filled 2012-06-16: qty 2

## 2012-06-16 MED ORDER — LORAZEPAM 0.5 MG PO TABS
0.5000 mg | ORAL_TABLET | Freq: Every day | ORAL | Status: DC
  Filled 2012-06-16: qty 1

## 2012-06-16 MED ORDER — GLYCOPYRROLATE 0.2 MG/ML IJ SOLN
INTRAMUSCULAR | Status: AC
  Filled 2012-06-16: qty 1

## 2012-06-16 MED ORDER — DEXTROSE 5 % IV SOLN: 1.0000 g | Freq: Once | INTRAVENOUS | Status: DC

## 2012-06-16 MED ORDER — ONDANSETRON HCL 4 MG/2ML IJ SOLN: 4.0000 mg | Freq: Once | INTRAMUSCULAR | Status: DC | PRN

## 2012-06-16 SURGICAL SUPPLY — 67 items
APPLICATOR COTTON TIP 6IN STRL (MISCELLANEOUS) ×2 IMPLANT
APPLIER CLIP ROT 10 11.4 M/L (STAPLE) ×2
APR CLP MED LRG 11.4X10 (STAPLE) ×1
ATTRACTOMAT 16X20 MAGNETIC DRP (DRAPES) IMPLANT
BAG HAMPER (MISCELLANEOUS) ×2 IMPLANT
BAG SPEC RTRVL LRG 6X4 10 (ENDOMECHANICALS) ×1
BLADE SURG 15 STRL LF DISP TIS (BLADE) ×1 IMPLANT
BLADE SURG 15 STRL SS (BLADE) ×2
BLADE SURG SZ10 CARB STEEL (BLADE) IMPLANT
CLIP APPLIE ROT 10 11.4 M/L (STAPLE) ×1 IMPLANT
CLOTH BEACON ORANGE TIMEOUT ST (SAFETY) ×2 IMPLANT
COVER LIGHT HANDLE STERIS (MISCELLANEOUS) ×4 IMPLANT
DECANTER SPIKE VIAL GLASS SM (MISCELLANEOUS) ×2 IMPLANT
DISSECTOR BLUNT TIP ENDO 5MM (MISCELLANEOUS) ×2 IMPLANT
DRAPE WARM FLUID 44X44 (DRAPE) IMPLANT
DRSG GAUZE PETRO 3X36 STRIP (GAUZE/BANDAGES/DRESSINGS) ×1 IMPLANT
DRSG TEGADERM 2-3/8X2-3/4 SM (GAUZE/BANDAGES/DRESSINGS) ×6 IMPLANT
ELECT BLADE 6 FLAT ULTRCLN (ELECTRODE) IMPLANT
ELECT REM PT RETURN 9FT ADLT (ELECTROSURGICAL) ×2
ELECTRODE REM PT RTRN 9FT ADLT (ELECTROSURGICAL) ×1 IMPLANT
EVACUATOR DRAINAGE 10X20 100CC (DRAIN) ×1 IMPLANT
EVACUATOR SILICONE 100CC (DRAIN) ×2
FILTER SMOKE EVAC LAPAROSHD (FILTER) ×2 IMPLANT
FORMALIN 10 PREFIL 120ML (MISCELLANEOUS) ×2 IMPLANT
GLOVE BIOGEL PI IND STRL 7.0 (GLOVE) IMPLANT
GLOVE BIOGEL PI INDICATOR 7.0 (GLOVE) ×2
GLOVE ECLIPSE 6.5 STRL STRAW (GLOVE) ×1 IMPLANT
GLOVE EXAM NITRILE MD LF STRL (GLOVE) ×1 IMPLANT
GLOVE SKINSENSE NS SZ6.5 (GLOVE) ×1
GLOVE SKINSENSE NS SZ7.0 (GLOVE) ×1
GLOVE SKINSENSE STRL SZ6.5 (GLOVE) IMPLANT
GLOVE SKINSENSE STRL SZ7.0 (GLOVE) ×1 IMPLANT
GOWN STRL REIN XL XLG (GOWN DISPOSABLE) ×6 IMPLANT
HEMOSTAT SURGICEL 4X8 (HEMOSTASIS) ×3 IMPLANT
INST SET LAPROSCOPIC AP (KITS) ×2 IMPLANT
IV NS IRRIG 3000ML ARTHROMATIC (IV SOLUTION) ×2 IMPLANT
KIT ROOM TURNOVER APOR (KITS) ×2 IMPLANT
KIT TROCAR LAP CHOLE (TROCAR) ×2 IMPLANT
MANIFOLD NEPTUNE II (INSTRUMENTS) ×2 IMPLANT
NS IRRIG 1000ML POUR BTL (IV SOLUTION) ×2 IMPLANT
PACK LAP CHOLE LZT030E (CUSTOM PROCEDURE TRAY) ×2 IMPLANT
PAD ARMBOARD 7.5X6 YLW CONV (MISCELLANEOUS) ×2 IMPLANT
PENCIL HANDSWITCHING (ELECTRODE) IMPLANT
POUCH SPECIMEN RETRIEVAL 10MM (ENDOMECHANICALS) ×2 IMPLANT
SET BASIN LINEN APH (SET/KITS/TRAYS/PACK) ×2 IMPLANT
SET TUBE IRRIG SUCTION NO TIP (IRRIGATION / IRRIGATOR) ×2 IMPLANT
SOL PREP PROV IODINE SCRUB 4OZ (MISCELLANEOUS) ×2 IMPLANT
SPONGE DRAIN TRACH 4X4 STRL 2S (GAUZE/BANDAGES/DRESSINGS) ×2 IMPLANT
SPONGE GAUZE 4X4 12PLY (GAUZE/BANDAGES/DRESSINGS) ×2 IMPLANT
SPONGE INTESTINAL PEANUT (DISPOSABLE) IMPLANT
SPONGE LAP 18X18 X RAY DECT (DISPOSABLE) IMPLANT
STAPLER VISISTAT 35W (STAPLE) ×2 IMPLANT
SUT ETHILON 3 0 FSL (SUTURE) ×2 IMPLANT
SUT SILK 2 0 (SUTURE)
SUT SILK 2 0 SH (SUTURE) IMPLANT
SUT SILK 2-0 18XBRD TIE 12 (SUTURE) IMPLANT
SUT SILK 3 0 SH CR/8 (SUTURE) IMPLANT
SUT VIC AB 0 CT1 27 (SUTURE)
SUT VIC AB 0 CT1 27XBRD ANTBC (SUTURE) IMPLANT
SUT VIC AB 0 CT1 27XCR 8 STRN (SUTURE) IMPLANT
SUT VICRYL 0 UR6 27IN ABS (SUTURE) ×4 IMPLANT
SYR BULB IRRIGATION 50ML (SYRINGE) ×1 IMPLANT
TOWEL OR 17X26 4PK STRL BLUE (TOWEL DISPOSABLE) ×2 IMPLANT
TRAY FOLEY CATH 14FR (SET/KITS/TRAYS/PACK) ×2 IMPLANT
WARMER LAPAROSCOPE (MISCELLANEOUS) ×2 IMPLANT
WATER STERILE IRR 1000ML POUR (IV SOLUTION) ×4 IMPLANT
YANKAUER SUCT BULB TIP 10FT TU (MISCELLANEOUS) IMPLANT

## 2012-06-16 NOTE — Anesthesia Procedure Notes (Signed)
Procedure Name: Intubation Date/Time: 06/16/2012 10:43 AM Performed by: Despina Hidden Pre-anesthesia Checklist: Emergency Drugs available, Suction available, Patient identified and Patient being monitored Patient Re-evaluated:Patient Re-evaluated prior to inductionOxygen Delivery Method: Circle system utilized Preoxygenation: Pre-oxygenation with 100% oxygen Intubation Type: IV induction Ventilation: Mask ventilation without difficulty Laryngoscope Size: Mac and 3 Grade View: Grade I Tube type: Oral Tube size: 7.0 mm Number of attempts: 1 Airway Equipment and Method: Stylet Placement Confirmation: ETT inserted through vocal cords under direct vision,  positive ETCO2 and breath sounds checked- equal and bilateral Secured at: 22 cm Tube secured with: Tape Dental Injury: Teeth and Oropharynx as per pre-operative assessment

## 2012-06-16 NOTE — Progress Notes (Signed)
72 yr. Old W.female for elective cholecystectomy for recurrent cholecystitis due to cholelithiasis.  Procedure and risks addressed and informed consent obtained and labs reviewed..  No change clinically from H&P.  Dict # [id number].  Filed Vitals:   06/16/12 0925  BP: 109/71  Pulse:   Temp:   Resp: 20  pulse 74/min.  Temp. 98.0

## 2012-06-16 NOTE — Anesthesia Postprocedure Evaluation (Signed)
  Anesthesia Post-op Note  Patient: Heidi Rhodes  Procedure(s) Performed: Procedure(s) (LRB): LAPAROSCOPIC CHOLECYSTECTOMY (N/A)  Patient Location: PACU  Anesthesia Type: General  Level of Consciousness: awake, alert , oriented and patient cooperative  Airway and Oxygen Therapy: Patient Spontanous Breathing  Post-op Pain: 2 /10, mild  Post-op Assessment: Post-op Vital signs reviewed, Patient's Cardiovascular Status Stable, Respiratory Function Stable, Patent Airway, No signs of Nausea or vomiting and Pain level controlled  Post-op Vital Signs: Reviewed and stable  Complications: No apparent anesthesia complications

## 2012-06-16 NOTE — Anesthesia Preprocedure Evaluation (Addendum)
Anesthesia Evaluation  Patient identified by MRN, date of birth, ID band Patient awake    Reviewed: Allergy & Precautions, H&P , NPO status , Patient's Chart, lab work & pertinent test results  Airway Mallampati: I TM Distance: >3 FB Neck ROM: Full    Dental No notable dental hx.    Pulmonary neg pulmonary ROS,    Pulmonary exam normal       Cardiovascular hypertension, Pt. on medications Rhythm:Regular Rate:Normal     Neuro/Psych negative neurological ROS  negative psych ROS   GI/Hepatic negative GI ROS, Neg liver ROS,   Endo/Other  negative endocrine ROS  Renal/GU negative Renal ROS     Musculoskeletal  (+) Arthritis -, Osteoarthritis,    Abdominal Normal abdominal exam  (+)   Peds  Hematology negative hematology ROS (+)   Anesthesia Other Findings   Reproductive/Obstetrics negative OB ROS                          Anesthesia Physical Anesthesia Plan  ASA: II  Anesthesia Plan: General   Post-op Pain Management:    Induction: Intravenous  Airway Management Planned: Oral ETT  Additional Equipment:   Intra-op Plan:   Post-operative Plan: Extubation in OR  Informed Consent: I have reviewed the patients History and Physical, chart, labs and discussed the procedure including the risks, benefits and alternatives for the proposed anesthesia with the patient or authorized representative who has indicated his/her understanding and acceptance.     Plan Discussed with: CRNA  Anesthesia Plan Comments:         Anesthesia Quick Evaluation

## 2012-06-16 NOTE — Progress Notes (Signed)
Ost OP Check  Awke and alert.  Has ambulated and has only some incisional discomfort.  Wound is clean and dry.  Minimal dark drainage (stained from surgicel) not bile.  Doing well. Filed Vitals:   06/16/12 1402  BP: 117/73  Pulse: 78  Temp: 97.8 F (36.6 C)  Resp: 19  Will D/C foley.

## 2012-06-16 NOTE — Op Note (Signed)
NAMEMERINDA, VICTORINO NO.:  [id number]  MEDICAL RECORD NO.:  [id number]  LOCATION:  A317                          FACILITY:  [facility]  PHYSICIAN:  Barbaraann Barthel, M.D. DATE OF BIRTH:  October 28, 1951  DATE OF PROCEDURE:  06/16/2012 DATE OF DISCHARGE:                              OPERATIVE REPORT   PREOPERATIVE DIAGNOSIS:  Cholecystitis, cholelithiasis.  POSTOPERATIVE DIAGNOSIS:  Cholecystitis, cholelithiasis.  PROCEDURE:  Laparoscopic cholecystectomy.  SPECIMEN:  Gallbladder and stones.  WOUND CLASSIFICATION:  Clean, contaminated.  NOTE:  This is a 61 year old white female who had recurrent episodes of right upper quadrant pain with nausea.  This was essentially postprandial in nature and then been incurring for years.  She was seen by Dr. Phillips Odor and then she was treated medically for this essentially and then she came to my office after having recurrent problems.  We discussed the need for surgery, discussing complications, not limited to, but including bleeding, infection, damage to bile ducts, perforation of organs, transitory diarrhea, and the possibility of open surgery might be required.  Informed consent was obtained.  GROSS OPERATIVE FINDINGS:  The patient had a gallbladder which was slightly intrahepatic with very little sort of a mesentery-type attached to it.  A normal size cystic duct which was not cannulated and adhesions about the gallbladder which were taken down without too much of a problem.  TECHNIQUE:  The patient was placed in supine position.  After the adequate administration of general anesthesia via endotracheal intubation, her entire abdomen was prepped with Betadine solution and draped in usual manner.  Foley catheter was aseptically inserted prior to this.  A periumbilical incision was carried out over the superior aspect of the umbilicus and a Veress needle was inserted and confirmed the position with a saline drop test.  I then  placed an 11-mm cannula in this incision in the Visiport technique and then under direct vision, an 11-mm cannula was placed in the epigastrium and two 5 mm cannulas in the right upper quadrant laterally.  The gallbladder was grasped.  Its adhesions were taken down.  There was a slight tear in the Gleason capsule.  This was not a problem.  This was easily controlled with cautery device.  We then identified the cystic duct clearly going into the gallbladder.  This was triply silver clipped and divided as was the cystic artery.  We then removed the gallbladder from the liver bed using the hook cautery device.  This was intrahepatic, however we were able to remove this without any spillage and without any very much bleeding.  I chose to leave a piece of Surgicel in the liver bed as well as near the area where there was a small tear in Gleason capsule.  I placed a Jackson-Pratt drain.  Jackson-Pratt drain was left in the liver bed and this exited through the lateral most incision.  After checking for hemostasis and irrigating, I then desufflated the abdomen and closed the surgical sites with 0 Polysorb in the area of the epigastrium and in the area of the umbilicus.  This required several sutures of 0 Polysorb as these stones were really quite large, easily a size of a hen egg.  After irrigating the wound, I used 0.5% Sensorcaine approximately 13 mL were used on all of the port sites for postoperative comfort.  Then stapled the incision with the surgical staples and sutured the drain in place with 3-0 nylon.  Prior to closure, all sponge, needle, and instrument counts were found to be correct.  Estimated blood loss was minimal.  The patient tolerated the procedure, was taken to the recovery room in satisfactory condition.     Barbaraann Barthel, M.D.     WB/MEDQ  D:  06/16/2012  T:  06/16/2012  Job:  [id number]  cc:   Dr. Phillips Odor

## 2012-06-16 NOTE — Plan of Care (Signed)
Problem: Phase I Progression Outcomes Goal: Pain controlled with appropriate interventions Outcome: Progressing Home pain med given.  Worked well.

## 2012-06-16 NOTE — Addendum Note (Signed)
Addendum  created 06/16/12 1309 by Andreyah Natividad, MD   Modules edited:Anesthesia Attestations    

## 2012-06-16 NOTE — Addendum Note (Signed)
Addendum  created 06/16/12 1309 by Roselie Awkward, MD   Modules edited:Anesthesia Attestations

## 2012-06-16 NOTE — Transfer of Care (Signed)
Immediate Anesthesia Transfer of Care Note  Patient: Heidi Rhodes  Procedure(s) Performed: Procedure(s) (LRB): LAPAROSCOPIC CHOLECYSTECTOMY (N/A)  Patient Location: PACU  Anesthesia Type: General  Level of Consciousness: awake and patient cooperative  Airway & Oxygen Therapy: Patient Spontanous Breathing and Patient connected to face mask oxygen  Post-op Assessment: Report given to PACU RN, Post -op Vital signs reviewed and stable and Patient moving all extremities  Post vital signs: Reviewed and stable  Complications: No apparent anesthesia complications

## 2012-06-16 NOTE — Brief Op Note (Signed)
06/16/2012  12:03 PM  PATIENT:  Heidi Rhodes  61 y.o. female  PRE-OPERATIVE DIAGNOSIS:  cholelithiasis  POST-OPERATIVE DIAGNOSIS:  cholelithiasis  PROCEDURE:  Procedure(s) (LRB): LAPAROSCOPIC CHOLECYSTECTOMY (N/A)  SURGEON:  Surgeon(s) and Role:    * Marlane Hatcher, MD - Primary  PHYSICIAN ASSISTANT:   ASSISTANTS: none   ANESTHESIA:   general  EBL:  Total I/O In: 1300 [I.V.:1300] Out: 400 [Urine:400]  BLOOD ADMINISTERED:none  DRAINS: Penrose drain in the liver bed   LOCAL MEDICATIONS USED:  MARCAINE   13 cc  SPECIMEN:  Source of Specimen:  gall bladder and stones.  DISPOSITION OF SPECIMEN:  PATHOLOGY  COUNTS:  YES  TOURNIQUET:  * No tourniquets in log *  DICTATION: .Other Dictation: Dictation Number OR dict. # [id number]  PLAN OF CARE: Admit for overnight observation  PATIENT DISPOSITION:  PACU - hemodynamically stable.   Delay start of Pharmacological VTE agent (>24hrs) due to surgical blood loss or risk of bleeding: not applicable

## 2012-06-17 LAB — CBC
HCT: 39.1 % (ref 36.0–46.0)
Hemoglobin: 12.8 g/dL (ref 12.0–15.0)
MCH: 28.5 pg (ref 26.0–34.0)
MCV: 87.1 fL (ref 78.0–100.0)
RBC: 4.49 MIL/uL (ref 3.87–5.11)
WBC: 6.9 10*3/uL (ref 4.0–10.5)

## 2012-06-17 LAB — HEPATIC FUNCTION PANEL
AST: 48 U/L — ABNORMAL HIGH (ref 0–37)
Albumin: 3.2 g/dL — ABNORMAL LOW (ref 3.5–5.2)
Total Protein: 6.3 g/dL (ref 6.0–8.3)

## 2012-06-17 LAB — BASIC METABOLIC PANEL
Calcium: 8.9 mg/dL (ref 8.4–10.5)
GFR calc non Af Amer: >90 mL/min (ref >90)
Glucose, Bld: 105 mg/dL — ABNORMAL HIGH (ref 70–99)
Potassium: 3.4 mEq/L — ABNORMAL LOW (ref 3.5–5.1)
Sodium: 141 mEq/L (ref 135–145)

## 2012-06-17 NOTE — Progress Notes (Signed)
Discharge instructions given to pt. With teach back given back to rn per patient. Pt. Placed in W/C and taken to car.

## 2012-06-17 NOTE — Discharge Summary (Signed)
Heidi Rhodes, Heidi Rhodes NO.:  [id number]  MEDICAL RECORD NO.:  [id number]  LOCATION:  A317                          FACILITY:  [facility]  PHYSICIAN:  Barbaraann Barthel, M.D. DATE OF BIRTH:  Mar 28, 1951  DATE OF ADMISSION:  06/16/2012 DATE OF DISCHARGE:  08/06/2013LH                              DISCHARGE SUMMARY   DIAGNOSIS:  Cholecystitis, cholelithiasis.  SECONDARY DIAGNOSES:  Obesity, hypertension, arthritis, anxiety.  PROCEDURE:  On July 17, 2012, laparoscopic cholecystectomy.  NOTE:  This is a 61 year old white female who had recurrent right upper quadrant pain.  She was noted to have cholelithiasis on sonogram and we discussed outpatient laparoscopic cholecystectomy with her.  She was admitted via the outpatient department at which time a laparoscopic cholecystectomy was carried out uneventfully.  She had 2 very large stones about the size of a hen egg within her gallbladder.  We were able to remove these laparoscopically without any problem and she was discharged on the following day.  At the time of discharge, she had minimal Jackson-Pratt drainage.  Her liver function studies were grossly within normal limits with her bilirubin being normal and she was voiding well and her wounds were clean and she had no leg pain, shortness of breath, or dysuria.  We ambulated her quickly because she had a past history of bilateral knee replacements.  This was not a problem either. We had a PT consult see her and help with the ambulation perioperatively.  Her hospital course was completely uneventful and she was discharged on the following day feeling much relief.  At the time of discharge, her Jackson-Pratt drain was removed.  Her wounds were redressed and followup arrangements were made.  For those details, please consult chart.  She is not going to be discharged on any new medications as she has already something for pain for her chronic arthritic  problems.     Barbaraann Barthel, M.D.     WB/MEDQ  D:  06/17/2012  T:  06/17/2012  Job:  [id number]  cc:   Dr. Phillips Odor

## 2012-06-17 NOTE — Progress Notes (Signed)
Pt has been ambulating with husband, having no difficulty.  She is up to a chair working on knee exercises at time of my arrival.  No PT is needed.

## 2012-06-17 NOTE — Progress Notes (Signed)
POD # 1  Filed Vitals:   06/17/12 1340  BP: 111/70  Pulse: 84  Temp: 98.3 F (36.8 C)  Resp: 20   Wounds clean and redressed.  Minimal JP drainage have removed tube, more serous in nature. Post op labs OK. Doing very well post and feels "better already". Discharge and follow up arranged.  Discharge dict. # [id number].

## 2012-06-17 NOTE — Addendum Note (Signed)
Addendum  created 06/17/12 0745 by Despina Hidden, CRNA   Modules edited:Notes Section

## 2012-06-17 NOTE — Progress Notes (Signed)
UR chart review completed.  

## 2012-06-17 NOTE — Anesthesia Postprocedure Evaluation (Signed)
  Anesthesia Post-op Note  Patient: Heidi Rhodes  Procedure(s) Performed: Procedure(s) (LRB): LAPAROSCOPIC CHOLECYSTECTOMY (N/A)  Patient Location: room 317  Anesthesia Type: General  Level of Consciousness: awake, alert , oriented and patient cooperative  Airway and Oxygen Therapy: Patient Spontanous Breathing  Post-op Pain: 3 /10, mild  Post-op Assessment: Post-op Vital signs reviewed, Patient's Cardiovascular Status Stable, Respiratory Function Stable, Patent Airway, No signs of Nausea or vomiting, Adequate PO intake and Pain level controlled  Post-op Vital Signs: Reviewed and stable  Complications: No apparent anesthesia complications

## 2012-06-18 ENCOUNTER — Encounter (HOSPITAL_COMMUNITY): Payer: Self-pay | Admitting: General Surgery

## 2013-03-23 ENCOUNTER — Other Ambulatory Visit (INDEPENDENT_AMBULATORY_CARE_PROVIDER_SITE_OTHER): Payer: Self-pay | Admitting: Internal Medicine

## 2013-04-29 ENCOUNTER — Other Ambulatory Visit (INDEPENDENT_AMBULATORY_CARE_PROVIDER_SITE_OTHER): Payer: Self-pay | Admitting: Internal Medicine

## 2013-04-29 NOTE — Telephone Encounter (Signed)
Patient needs Office visit

## 2013-05-18 ENCOUNTER — Other Ambulatory Visit (HOSPITAL_COMMUNITY): Payer: Self-pay | Admitting: Family Medicine

## 2013-05-18 DIAGNOSIS — Z139 Encounter for screening, unspecified: Secondary | ICD-10-CM

## 2013-05-28 ENCOUNTER — Ambulatory Visit (HOSPITAL_COMMUNITY)
Admission: RE | Admit: 2013-05-28 | Discharge: 2013-05-28 | Disposition: A | Discharge status: Home / Self Care | Payer: BC Managed Care – PPO | Source: Ambulatory Visit | Attending: Family Medicine | Admitting: Family Medicine

## 2013-05-28 DIAGNOSIS — Z139 Encounter for screening, unspecified: Secondary | ICD-10-CM

## 2013-05-28 DIAGNOSIS — Z1231 Encounter for screening mammogram for malignant neoplasm of breast: Secondary | ICD-10-CM | POA: Insufficient documentation

## 2014-06-17 ENCOUNTER — Other Ambulatory Visit (HOSPITAL_COMMUNITY): Payer: Self-pay | Admitting: Family Medicine

## 2014-06-17 DIAGNOSIS — Z139 Encounter for screening, unspecified: Secondary | ICD-10-CM

## 2014-06-30 ENCOUNTER — Ambulatory Visit (HOSPITAL_COMMUNITY)
Admission: RE | Admit: 2014-06-30 | Discharge: 2014-06-30 | Disposition: A | Discharge status: Home / Self Care | Payer: BC Managed Care – PPO | Source: Ambulatory Visit | Attending: Family Medicine | Admitting: Family Medicine

## 2014-06-30 DIAGNOSIS — Z1231 Encounter for screening mammogram for malignant neoplasm of breast: Secondary | ICD-10-CM | POA: Diagnosis present

## 2014-06-30 DIAGNOSIS — Z139 Encounter for screening, unspecified: Secondary | ICD-10-CM

## 2015-01-19 ENCOUNTER — Ambulatory Visit (INDEPENDENT_AMBULATORY_CARE_PROVIDER_SITE_OTHER): Payer: BC Managed Care – PPO | Admitting: Internal Medicine

## 2015-01-19 ENCOUNTER — Encounter (INDEPENDENT_AMBULATORY_CARE_PROVIDER_SITE_OTHER): Payer: Self-pay | Admitting: Internal Medicine

## 2015-01-19 ENCOUNTER — Encounter (INDEPENDENT_AMBULATORY_CARE_PROVIDER_SITE_OTHER): Payer: Self-pay | Admitting: *Deleted

## 2015-01-19 VITALS — BP 118/62 | HR 76 | Temp 97.5°F | Ht 66.0 in | Wt 273.7 lb

## 2015-01-19 DIAGNOSIS — K760 Fatty (change of) liver, not elsewhere classified: Secondary | ICD-10-CM

## 2015-01-19 DIAGNOSIS — R748 Abnormal levels of other serum enzymes: Secondary | ICD-10-CM

## 2015-01-19 LAB — HEPATIC FUNCTION PANEL
ALT: 78 U/L — AB (ref 0–35)
AST: 52 U/L — AB (ref 0–37)
Albumin: 4 g/dL (ref 3.5–5.2)
Alkaline Phosphatase: 58 U/L (ref 39–117)
BILIRUBIN DIRECT: 0.2 mg/dL (ref 0.0–0.3)
BILIRUBIN INDIRECT: 0.6 mg/dL (ref 0.2–1.2)
TOTAL PROTEIN: 7.1 g/dL (ref 6.0–8.3)
Total Bilirubin: 0.8 mg/dL (ref 0.2–1.2)

## 2015-01-19 NOTE — Progress Notes (Addendum)
Subjective:    Patient ID: Heidi Rhodes, female    DOB: 11-21-1950, 64 y.o.   MRN: [id number]  HPI Referred to our office by Dr. Hilma Favors for elevated liver enzymes. She tells me her liver enzymes have never been elevated in the past the best to her knowledge.  No hx of jaundice. No IV drug use. No tattoos.  Appetite is good. No weight loss. Occasionally has dysphagia. She usually has a BM daily and sometimes two a day. Takes Cholestyramine daily for daily (1/2 pack).    Labs 11/23/2014 He A ab IgM negative, HBsAg negative, Hep B core Ab, IgM negative, Hep C antibody negative. TSH 1.580, GGT 323, AST 67, ALT 99, ALP 62, Total bil 0.7. H andH 15.9 and 45.8, MCV 88, Platelet ct 205  CMP Latest Ref Rng 01/19/2015 06/17/2012 06/09/2012  Glucose 70 - 99 mg/dL - 105(H) 112(H)  BUN 6 - 23 mg/dL - 9 16  Creatinine 0.50 - 1.10 mg/dL - 0.68 0.67  Sodium 135 - 145 mEq/L - 141 145  Potassium 3.5 - 5.1 mEq/L - 3.4(L) 4.2  Chloride 96 - 112 mEq/L - 100 108  CO2 19 - 32 mEq/L - 32 27  Calcium 8.4 - 10.5 mg/dL - 8.9 10.0  Total Protein 6.0 - 8.3 g/dL 7.1 6.3 7.4  Total Bilirubin 0.2 - 1.2 mg/dL 0.8 0.5 0.3  Alkaline Phos 39 - 117 U/L 58 64 73  AST 0 - 37 U/L 52(H) 48(H) 22  ALT 0 - 35 U/L 78(H) 43(H) 25     06/10/2012 US abdomen:  IMPRESSION: 1. Multiple large echogenic, shadowing gallstones with interval development of minimal gallbladder wall thickening and possible wall nodularity versus focal fatty infiltration within the hepatic parenchyma adjacent to the gallbladder fossa. Findings remain indeterminate for acute cholecystitis as there is no pericholecystic fluid and the sonographic Murphy's sign was negative. Clinically correlation is advised.  2. Interval development of minimal gallbladder wall nodularity versus focal fatty infiltration adjacent to the gallbladder fossa.  3. Findings suggestive of hepatic steatosis.  4. Sub centimeter well-defined hyperechoic lesion  within the superior pole of the left kidney, too small to accurately characterize but may represent a small renal AML versus a hyperdense cyst.  02/13/2012 Colonoscopy:  Screening : Dr. Laural Golden.  Impression:  Examination performed to cecum. Mild sigmoid colon diverticulosis. Small external hemorrhoids and anal papillae.    Review of Systems Married. Three children in good health.  Retired from school system.     Past Medical History  Diagnosis Date  . HTN (hypertension)   . Arthritis   . Gallbladder disorder   . Anxiety     about being put to sleep for  surgery  . High cholesterol     Past Surgical History  Procedure Laterality Date  . Left knee replacement      2012  . Appendectomy    . Ceasarean section    . Colonoscopy  02/13/2012    Procedure: COLONOSCOPY;  Surgeon: Rogene Houston, MD;  Location: [facility];  Service: Endoscopy;  Laterality: N/A;  930 possibly earlier for antibiotics  . Total knee arthroplasty  04/18/2012    Procedure: TOTAL KNEE ARTHROPLASTY;  Surgeon: Yvette Rack., MD;  Location: [place];  Service: Orthopedics;  Laterality: Right;  with revison tibia  . Cholecystectomy  06/16/2012    Procedure: LAPAROSCOPIC CHOLECYSTECTOMY;  Surgeon: Scherry Ran, MD;  Location: [facility];  Service: General;  Laterality: N/A;  No Known Allergies  Current Outpatient Prescriptions on File Prior to Visit  Medication Sig Dispense Refill  . amLODipine (NORVASC) 5 MG tablet Take 5 mg by mouth daily.    . hydrochlorothiazide (HYDRODIURIL) 25 MG tablet Take 25 mg by mouth daily.    . hyoscyamine (LEVSIN SL) 0.125 MG SL tablet Take 1 tablet (0.125 mg total) by mouth every 6 (six) hours as needed for cramping. 100 tablet 1   No current facility-administered medications on file prior to visit.     Medication List       This list is accurate as of: 01/19/15  9:10 AM.  Always use your most recent med list.               AIRBORNE PO  Take by mouth every morning.       amLODipine 5 MG tablet  Commonly known as:  NORVASC  Take 5 mg by mouth daily.     amoxicillin 500 MG capsule  Commonly known as:  AMOXIL  Take 500 mg by mouth. Before dental procedure     cholestyramine 4 G packet  Commonly known as:  QUESTRAN  Take 2 g by mouth 3 (three) times daily with meals.     hydrochlorothiazide 25 MG tablet  Commonly known as:  HYDRODIURIL  Take 25 mg by mouth daily.     hyoscyamine 0.125 MG SL tablet  Commonly known as:  LEVSIN SL  Take 1 tablet (0.125 mg total) by mouth every 6 (six) hours as needed for cramping.     multivitamin tablet  Take 1 tablet by mouth daily.     PROBIOTIC DAILY PO  Take by mouth.           Objective:   Physical Exam Filed Vitals:   01/19/15 0843  Height: 5\' 6"  (1.676 m)  Weight: 273 lb 11.2 oz (124.15 kg)    Alert and oriented. Skin warm and dry. Oral mucosa is moist.   . Sclera anicteric, conjunctivae is pink. Thyroid not enlarged. No cervical lymphadenopathy. Lungs clear. Heart regular rate and rhythm.  Abdomen is soft. Bowel sounds are positive. No hepatomegaly. No abdominal masses felt. No tenderness.  No edema to lower extremities.       Assessment & Plan:  Elevated liver enzymes. Fatty liver. Patient needs to diet and exercise. [place] RUQ, and hepatic function today.

## 2015-01-19 NOTE — Patient Instructions (Signed)
Hepatic function, [place] RUQ. OV in 6 weeks.  Diet and exercise

## 2015-01-20 ENCOUNTER — Ambulatory Visit (HOSPITAL_COMMUNITY)
Admission: RE | Admit: 2015-01-20 | Discharge: 2015-01-20 | Disposition: A | Discharge status: Home / Self Care | Payer: BC Managed Care – PPO | Source: Ambulatory Visit | Attending: Internal Medicine | Admitting: Internal Medicine

## 2015-01-20 DIAGNOSIS — R748 Abnormal levels of other serum enzymes: Secondary | ICD-10-CM | POA: Diagnosis present

## 2015-01-20 DIAGNOSIS — K760 Fatty (change of) liver, not elsewhere classified: Secondary | ICD-10-CM | POA: Insufficient documentation

## 2015-01-26 ENCOUNTER — Telehealth (INDEPENDENT_AMBULATORY_CARE_PROVIDER_SITE_OTHER): Payer: Self-pay | Admitting: *Deleted

## 2015-01-26 DIAGNOSIS — K760 Fatty (change of) liver, not elsewhere classified: Secondary | ICD-10-CM

## 2015-01-26 NOTE — Telephone Encounter (Signed)
.  Per Lelon Perla patient to have labs done in 3 months.

## 2015-01-28 ENCOUNTER — Encounter (INDEPENDENT_AMBULATORY_CARE_PROVIDER_SITE_OTHER): Payer: Self-pay

## 2015-04-06 ENCOUNTER — Encounter (INDEPENDENT_AMBULATORY_CARE_PROVIDER_SITE_OTHER): Payer: Self-pay | Admitting: *Deleted

## 2015-04-06 ENCOUNTER — Other Ambulatory Visit (INDEPENDENT_AMBULATORY_CARE_PROVIDER_SITE_OTHER): Payer: Self-pay | Admitting: *Deleted

## 2015-04-06 DIAGNOSIS — K760 Fatty (change of) liver, not elsewhere classified: Secondary | ICD-10-CM

## 2015-04-29 ENCOUNTER — Ambulatory Visit (INDEPENDENT_AMBULATORY_CARE_PROVIDER_SITE_OTHER): Payer: BC Managed Care – PPO | Admitting: Internal Medicine

## 2015-04-29 ENCOUNTER — Encounter (INDEPENDENT_AMBULATORY_CARE_PROVIDER_SITE_OTHER): Payer: Self-pay | Admitting: Internal Medicine

## 2015-04-29 ENCOUNTER — Telehealth (INDEPENDENT_AMBULATORY_CARE_PROVIDER_SITE_OTHER): Payer: Self-pay | Admitting: *Deleted

## 2015-04-29 VITALS — BP 156/84 | HR 88 | Temp 97.5°F | Ht 66.0 in | Wt 267.8 lb

## 2015-04-29 DIAGNOSIS — R748 Abnormal levels of other serum enzymes: Secondary | ICD-10-CM

## 2015-04-29 DIAGNOSIS — K760 Fatty (change of) liver, not elsewhere classified: Secondary | ICD-10-CM

## 2015-04-29 NOTE — Patient Instructions (Addendum)
OV 3 months. Diet and exercise

## 2015-04-29 NOTE — Telephone Encounter (Signed)
.  Per Lelon Perla patient will need to have lab work in 3 months.

## 2015-04-29 NOTE — Progress Notes (Addendum)
Subjective:    Patient ID: Heidi Rhodes, female    DOB: Mar 15, 1951, 64 y.o.   MRN: [id number]  HPI Here today for f/u of her elevated liver enzymes and fatty liver. Liver enzymes normal in 2013. Hepatitis A, B, C negative.  No hx of jaundice. No IV drug use. No tattoos.   Weight 272 in March, Toay her weight is 267.3. No family hx of fatty liver. Appetite is good. She is on a low fat diet. She is going to try to exercise. Liver enzymes are gradually coming down.  No abdominal pain. BMs x 1 a day. Takes Questran for diarrhea. Diarrhea since GB removed     01/20/2015 [place] RUQ: IMPRESSION: Common bile duct measures 10 mm, upper limits of normal status post cholecystectomy. In the setting of abnormal LFTs, consider correlation with MRCP as clinically indicated. Hepatic steatosis.    66/13/2016 total bili 0.5, ALP 67, AST 33, ALT 56    Labs 11/23/2014 He A ab IgM negative, HBsAg negative, Hep B core Ab, IgM negative, Hep C antibody negative. TSH 1.580,  ggt 32   CMP Latest Ref Rng 01/19/2015 06/17/2012 06/09/2012  Glucose 70 - 99 mg/dL - 105(H) 112(H)  BUN 6 - 23 mg/dL - 9 16  Creatinine 0.50 - 1.10 mg/dL - 0.68 0.67  Sodium 135 - 145 mEq/L - 141 145  Potassium 3.5 - 5.1 mEq/L - 3.4(L) 4.2  Chloride 96 - 112 mEq/L - 100 108  CO2 19 - 32 mEq/L - 32 27  Calcium 8.4 - 10.5 mg/dL - 8.9 10.0  Total Protein 6.0 - 8.3 g/dL 7.1 6.3 7.4  Total Bilirubin 0.2 - 1.2 mg/dL 0.8 0.5 0.3  Alkaline Phos 39 - 117 U/L 58 64 73  AST 0 - 37 U/L 52(H) 48(H) 22  ALT 0 - 35 U/L 78(H) 43(H) 25   CBC 11/23/2014 H and H 15.9 and 45.3, MCV 88, platelet ct 205        Review of Systems Past Medical History  Diagnosis Date  . HTN (hypertension)   . Arthritis   . Gallbladder disorder   . Anxiety     about being put to sleep for  surgery  . High cholesterol     Past Surgical History  Procedure Laterality Date  . Left knee replacement      2012  . Appendectomy    . Ceasarean section      . Colonoscopy  02/13/2012    Procedure: COLONOSCOPY;  Surgeon: Rogene Houston, MD;  Location: [facility];  Service: Endoscopy;  Laterality: N/A;  930 possibly earlier for antibiotics  . Total knee arthroplasty  04/18/2012    Procedure: TOTAL KNEE ARTHROPLASTY;  Surgeon: Yvette Rack., MD;  Location: [place];  Service: Orthopedics;  Laterality: Right;  with revison tibia  . Cholecystectomy  06/16/2012    Procedure: LAPAROSCOPIC CHOLECYSTECTOMY;  Surgeon: Scherry Ran, MD;  Location: [facility];  Service: General;  Laterality: N/A;    No Known Allergies  Current Outpatient Prescriptions on File Prior to Visit  Medication Sig Dispense Refill  . amLODipine (NORVASC) 5 MG tablet Take 5 mg by mouth daily.    . cholestyramine (QUESTRAN) 4 G packet Take 2 g by mouth 3 (three) times daily with meals.    . hydrochlorothiazide (HYDRODIURIL) 25 MG tablet Take 25 mg by mouth daily.    . hyoscyamine (LEVSIN SL) 0.125 MG SL tablet Take 1 tablet (0.125 mg total) by mouth  every 6 (six) hours as needed for cramping. 100 tablet 1  . Multiple Vitamin (MULTIVITAMIN) tablet Take 1 tablet by mouth daily.    . Multiple Vitamins-Minerals (AIRBORNE PO) Take by mouth every morning.    . Probiotic Product (PROBIOTIC DAILY PO) Take by mouth.     No current facility-administered medications on file prior to visit.   Married. 4 living children in good health. One deceased at birth.      Objective:   Physical Exam Blood pressure 156/84, pulse 88, temperature 97.5 F (36.4 C), height 5\' 6"  (1.676 m), weight 267 lb 12.8 oz (121.473 kg). Alert and oriented. Skin warm and dry. Oral mucosa is moist.   . Sclera anicteric, conjunctivae is pink. Thyroid not enlarged. No cervical lymphadenopathy. Lungs clear. Heart regular rate and rhythm.  Abdomen is soft. Bowel sounds are positive. No hepatomegaly. No abdominal masses felt. No tenderness. Obese  No edema to lower extremities.          Assessment & Plan:  Fatty liver,  elevated liver enzymes: Will repeat Hepatic function today. Ferritin, Ceruplasmin, Alpha 1 antitrypsin, ANA, SMA to rule out auto immune process. OV in 3 month with an Hepatic function. Encourage to exercise and gradual weight loss.

## 2015-04-29 NOTE — Telephone Encounter (Signed)
.  Per Lelon Perla patient to have these labs completed.

## 2015-05-06 ENCOUNTER — Encounter (INDEPENDENT_AMBULATORY_CARE_PROVIDER_SITE_OTHER): Payer: Self-pay

## 2015-07-05 ENCOUNTER — Telehealth (INDEPENDENT_AMBULATORY_CARE_PROVIDER_SITE_OTHER): Payer: Self-pay | Admitting: *Deleted

## 2015-07-05 ENCOUNTER — Encounter (INDEPENDENT_AMBULATORY_CARE_PROVIDER_SITE_OTHER): Payer: Self-pay | Admitting: *Deleted

## 2015-07-05 DIAGNOSIS — R748 Abnormal levels of other serum enzymes: Secondary | ICD-10-CM

## 2015-07-05 NOTE — Telephone Encounter (Signed)
Lab work noted for 07-30-15.

## 2015-08-03 ENCOUNTER — Ambulatory Visit (INDEPENDENT_AMBULATORY_CARE_PROVIDER_SITE_OTHER): Payer: BC Managed Care – PPO | Admitting: Internal Medicine

## 2016-01-19 DIAGNOSIS — I10 Essential (primary) hypertension: Secondary | ICD-10-CM | POA: Diagnosis not present

## 2016-01-19 DIAGNOSIS — R7301 Impaired fasting glucose: Secondary | ICD-10-CM | POA: Diagnosis not present

## 2016-04-19 DIAGNOSIS — R05 Cough: Secondary | ICD-10-CM | POA: Diagnosis not present

## 2016-04-19 DIAGNOSIS — J060 Acute laryngopharyngitis: Secondary | ICD-10-CM | POA: Diagnosis not present

## 2016-08-06 DIAGNOSIS — R7301 Impaired fasting glucose: Secondary | ICD-10-CM | POA: Diagnosis not present

## 2016-08-06 DIAGNOSIS — I10 Essential (primary) hypertension: Secondary | ICD-10-CM | POA: Diagnosis not present

## 2016-08-06 DIAGNOSIS — K760 Fatty (change of) liver, not elsewhere classified: Secondary | ICD-10-CM | POA: Diagnosis not present

## 2016-08-08 DIAGNOSIS — Z23 Encounter for immunization: Secondary | ICD-10-CM | POA: Diagnosis not present

## 2016-08-08 DIAGNOSIS — R4181 Age-related cognitive decline: Secondary | ICD-10-CM | POA: Diagnosis not present

## 2016-08-08 DIAGNOSIS — K760 Fatty (change of) liver, not elsewhere classified: Secondary | ICD-10-CM | POA: Diagnosis not present

## 2016-08-08 DIAGNOSIS — R7301 Impaired fasting glucose: Secondary | ICD-10-CM | POA: Diagnosis not present

## 2016-08-08 DIAGNOSIS — Z0000 Encounter for general adult medical examination without abnormal findings: Secondary | ICD-10-CM | POA: Diagnosis not present

## 2016-08-08 DIAGNOSIS — R197 Diarrhea, unspecified: Secondary | ICD-10-CM | POA: Diagnosis not present

## 2016-08-08 DIAGNOSIS — I10 Essential (primary) hypertension: Secondary | ICD-10-CM | POA: Diagnosis not present

## 2016-08-10 DIAGNOSIS — Z23 Encounter for immunization: Secondary | ICD-10-CM | POA: Diagnosis not present

## 2017-02-04 DIAGNOSIS — I10 Essential (primary) hypertension: Secondary | ICD-10-CM | POA: Diagnosis not present

## 2017-02-04 DIAGNOSIS — Z1159 Encounter for screening for other viral diseases: Secondary | ICD-10-CM | POA: Diagnosis not present

## 2017-02-04 DIAGNOSIS — R7301 Impaired fasting glucose: Secondary | ICD-10-CM | POA: Diagnosis not present

## 2017-02-06 DIAGNOSIS — K760 Fatty (change of) liver, not elsewhere classified: Secondary | ICD-10-CM | POA: Diagnosis not present

## 2017-02-06 DIAGNOSIS — R7301 Impaired fasting glucose: Secondary | ICD-10-CM | POA: Diagnosis not present

## 2017-02-06 DIAGNOSIS — I10 Essential (primary) hypertension: Secondary | ICD-10-CM | POA: Diagnosis not present

## 2017-08-08 DIAGNOSIS — I10 Essential (primary) hypertension: Secondary | ICD-10-CM | POA: Diagnosis not present

## 2017-08-08 DIAGNOSIS — R7301 Impaired fasting glucose: Secondary | ICD-10-CM | POA: Diagnosis not present

## 2017-08-12 DIAGNOSIS — R3981 Functional urinary incontinence: Secondary | ICD-10-CM | POA: Diagnosis not present

## 2017-08-12 DIAGNOSIS — K760 Fatty (change of) liver, not elsewhere classified: Secondary | ICD-10-CM | POA: Diagnosis not present

## 2017-08-12 DIAGNOSIS — R197 Diarrhea, unspecified: Secondary | ICD-10-CM | POA: Diagnosis not present

## 2017-08-12 DIAGNOSIS — Z0000 Encounter for general adult medical examination without abnormal findings: Secondary | ICD-10-CM | POA: Diagnosis not present

## 2017-08-12 DIAGNOSIS — I10 Essential (primary) hypertension: Secondary | ICD-10-CM | POA: Diagnosis not present

## 2017-08-12 DIAGNOSIS — Z6841 Body Mass Index (BMI) 40.0 and over, adult: Secondary | ICD-10-CM | POA: Diagnosis not present

## 2017-08-12 DIAGNOSIS — R7301 Impaired fasting glucose: Secondary | ICD-10-CM | POA: Diagnosis not present

## 2017-09-09 DIAGNOSIS — Z2821 Immunization not carried out because of patient refusal: Secondary | ICD-10-CM | POA: Diagnosis not present

## 2017-09-09 DIAGNOSIS — I10 Essential (primary) hypertension: Secondary | ICD-10-CM | POA: Diagnosis not present

## 2017-09-09 DIAGNOSIS — R197 Diarrhea, unspecified: Secondary | ICD-10-CM | POA: Diagnosis not present

## 2017-10-02 ENCOUNTER — Ambulatory Visit (HOSPITAL_COMMUNITY)
Admission: RE | Admit: 2017-10-02 | Discharge: 2017-10-02 | Disposition: A | Discharge status: Home / Self Care | Payer: Medicare Other | Source: Ambulatory Visit | Attending: Internal Medicine | Admitting: Internal Medicine

## 2017-10-02 ENCOUNTER — Other Ambulatory Visit (HOSPITAL_COMMUNITY): Payer: Self-pay | Admitting: Internal Medicine

## 2017-10-02 DIAGNOSIS — I10 Essential (primary) hypertension: Secondary | ICD-10-CM | POA: Diagnosis not present

## 2017-10-02 DIAGNOSIS — H538 Other visual disturbances: Secondary | ICD-10-CM | POA: Diagnosis not present

## 2017-10-02 DIAGNOSIS — R42 Dizziness and giddiness: Secondary | ICD-10-CM

## 2017-10-02 DIAGNOSIS — R930 Abnormal findings on diagnostic imaging of skull and head, not elsewhere classified: Secondary | ICD-10-CM | POA: Diagnosis not present

## 2017-10-05 DIAGNOSIS — Z712 Person consulting for explanation of examination or test findings: Secondary | ICD-10-CM | POA: Diagnosis not present

## 2017-10-05 DIAGNOSIS — H538 Other visual disturbances: Secondary | ICD-10-CM | POA: Diagnosis not present

## 2017-10-05 DIAGNOSIS — R479 Unspecified speech disturbances: Secondary | ICD-10-CM | POA: Diagnosis not present

## 2017-10-05 DIAGNOSIS — M6281 Muscle weakness (generalized): Secondary | ICD-10-CM | POA: Diagnosis not present

## 2017-10-05 DIAGNOSIS — R42 Dizziness and giddiness: Secondary | ICD-10-CM | POA: Diagnosis not present

## 2017-10-05 DIAGNOSIS — Z6841 Body Mass Index (BMI) 40.0 and over, adult: Secondary | ICD-10-CM | POA: Diagnosis not present

## 2017-10-07 ENCOUNTER — Other Ambulatory Visit (HOSPITAL_COMMUNITY): Payer: Self-pay | Admitting: Internal Medicine

## 2017-10-07 ENCOUNTER — Ambulatory Visit (HOSPITAL_COMMUNITY)
Admission: RE | Admit: 2017-10-07 | Discharge: 2017-10-07 | Disposition: A | Discharge status: Home / Self Care | Payer: Medicare Other | Source: Ambulatory Visit | Attending: Internal Medicine | Admitting: Internal Medicine

## 2017-10-07 DIAGNOSIS — I6522 Occlusion and stenosis of left carotid artery: Secondary | ICD-10-CM | POA: Diagnosis not present

## 2017-10-07 DIAGNOSIS — R42 Dizziness and giddiness: Secondary | ICD-10-CM

## 2017-10-07 DIAGNOSIS — I6523 Occlusion and stenosis of bilateral carotid arteries: Secondary | ICD-10-CM | POA: Diagnosis not present

## 2017-10-07 DIAGNOSIS — I6389 Other cerebral infarction: Secondary | ICD-10-CM | POA: Diagnosis not present

## 2017-10-07 LAB — POCT I-STAT, CHEM 8
BUN: 18 mg/dL (ref 6–20)
CREATININE: 0.7 mg/dL (ref 0.44–1.00)
Calcium, Ion: 1.24 mmol/L (ref 1.15–1.40)
Chloride: 101 mmol/L (ref 101–111)
GLUCOSE: 112 mg/dL — AB (ref 65–99)
HEMATOCRIT: 48 % — AB (ref 36.0–46.0)
HEMOGLOBIN: 16.3 g/dL — AB (ref 12.0–15.0)
Potassium: 3.9 mmol/L (ref 3.5–5.1)
Sodium: 143 mmol/L (ref 135–145)
TCO2: 28 mmol/L (ref 22–32)

## 2017-10-07 MED ORDER — SODIUM CHLORIDE 0.9% FLUSH
INTRAVENOUS | Status: AC
  Filled 2017-10-07: qty 250

## 2017-10-07 MED ORDER — GADOBENATE DIMEGLUMINE 529 MG/ML IV SOLN
20.0000 mL | Freq: Once | INTRAVENOUS | Status: AC | PRN
  Administered 2017-10-07: 20 mL via INTRAVENOUS

## 2017-10-08 ENCOUNTER — Other Ambulatory Visit (HOSPITAL_COMMUNITY): Payer: Self-pay | Admitting: Internal Medicine

## 2017-10-08 DIAGNOSIS — R42 Dizziness and giddiness: Secondary | ICD-10-CM

## 2017-10-09 ENCOUNTER — Encounter: Payer: Self-pay | Admitting: Diagnostic Neuroimaging

## 2017-10-09 ENCOUNTER — Ambulatory Visit (INDEPENDENT_AMBULATORY_CARE_PROVIDER_SITE_OTHER): Payer: Medicare Other | Admitting: Diagnostic Neuroimaging

## 2017-10-09 VITALS — BP 127/64 | HR 69 | Ht 66.0 in | Wt 263.8 lb

## 2017-10-09 DIAGNOSIS — R0683 Snoring: Secondary | ICD-10-CM

## 2017-10-09 DIAGNOSIS — I6349 Cerebral infarction due to embolism of other cerebral artery: Secondary | ICD-10-CM | POA: Diagnosis not present

## 2017-10-09 NOTE — Progress Notes (Signed)
[facility]  PATIENT: Heidi Rhodes DOB: 03/21/1951  REFERRING CLINICIAN: Merlyn Albert, MD HISTORY FROM: patient, husband, chart review REASON FOR VISIT: new consult    HISTORICAL  CHIEF COMPLAINT:  Chief Complaint  Patient presents with  . NP Delphina Cahill  . Cerebrovascular Accident    Difficult to write. Vision problems (L eye worse).  R sided weakness.  No therapy ordered.  MRI 10-08-17 and echo 10-10-17     HISTORY OF PRESENT ILLNESS:   66 year old female with hypertension, here for evaluation of stroke.  November 18 patient was at a funeral home when she noticed right leg weakness.  November 20 she had worsening right leg weakness and then her balance was off.  She had some vertigo sensation.  November 21 she went to PCP who ordered a CT scan.  Slight abnormality was noted in left parietal region. On November 22nd patient had increasing slurred speech.  On November 26 patient MRI which confirmed subacute left lower midbrain, upper pons ischemic infarction.  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  Since that time symptoms have stabilized and slightly improved.   REVIEW OF SYSTEMS: Full 14 system review of systems performed and negative with exception of: Fatigue blurred vision memory loss confusion numbness weakness slurred speech incontinence hearing loss diarrhea.  ALLERGIES: No Known Allergies  HOME MEDICATIONS: Outpatient Medications Prior to Visit  Medication Sig Dispense Refill  . amLODipine (NORVASC) 5 MG tablet Take 5 mg by mouth daily.    Marland Kitchen aspirin 325 MG tablet Take 325 mg by mouth daily.    . hydrochlorothiazide (HYDRODIURIL) 25 MG tablet Take 25 mg by mouth daily.    Marland Kitchen lisinopril (PRINIVIL,ZESTRIL) 10 MG tablet Take 10 mg by mouth daily.    . Multiple Vitamin (MULTIVITAMIN) tablet Take 1 tablet by mouth daily.    . Multiple Vitamins-Minerals (AIRBORNE PO) Take by mouth every morning.    . Pancrelipase, Lip-Prot-Amyl, (ZENPEP PO) Take  by mouth. 20,000cm daily    . Probiotic Product (PROBIOTIC DAILY PO) Take by mouth.    . cholestyramine (QUESTRAN) 4 G packet Take 2 g by mouth 3 (three) times daily with meals.    . hyoscyamine (LEVSIN SL) 0.125 MG SL tablet Take 1 tablet (0.125 mg total) by mouth every 6 (six) hours as needed for cramping. (Patient not taking: Reported on 10/09/2017) 100 tablet 1  . meloxicam (MOBIC) 15 MG tablet Take 15 mg by mouth daily.     No facility-administered medications prior to visit.     PAST MEDICAL HISTORY: Past Medical History:  Diagnosis Date  . Anxiety    about being put to sleep for  surgery  . Arthritis   . Gallbladder disorder   . High cholesterol   . HTN (hypertension)     PAST SURGICAL HISTORY: Past Surgical History:  Procedure Laterality Date  . APPENDECTOMY    . ceasarean section    . CHOLECYSTECTOMY  06/16/2012   Procedure: LAPAROSCOPIC CHOLECYSTECTOMY;  Surgeon: Scherry Ran, MD;  Location: [facility];  Service: General;  Laterality: N/A;  . COLONOSCOPY  02/13/2012   Procedure: COLONOSCOPY;  Surgeon: Rogene Houston, MD;  Location: [facility];  Service: Endoscopy;  Laterality: N/A;  930 possibly earlier for antibiotics  . Left knee replacement     2012  . TOTAL KNEE ARTHROPLASTY  04/18/2012   Procedure: TOTAL KNEE ARTHROPLASTY;  Surgeon: Yvette Rack., MD;  Location: [facility];  Service: Orthopedics;  Laterality:  Right;  with revison tibia    FAMILY HISTORY: Family History  Problem Relation Age of Onset  . Heart attack Father   . Anesthesia problems Neg Hx     SOCIAL HISTORY:  Social History   Socioeconomic History  . Marital status: Married    Spouse name: Not on file  . Number of children: Not on file  . Years of education: Not on file  . Highest education level: Not on file  Social Needs  . Financial resource strain: Not on file  . Food insecurity - worry: Not on file  . Food insecurity - inability: Not on file  . Transportation needs - medical: Not  on file  . Transportation needs - non-medical: Not on file  Occupational History  . Not on file  Tobacco Use  . Smoking status: Never Smoker  . Smokeless tobacco: Never Used  Substance and Sexual Activity  . Alcohol use: No  . Drug use: No  . Sexual activity: Yes    Birth control/protection: None  Other Topics Concern  . Not on file  Social History Narrative   Lives at home with husband.       PHYSICAL EXAM  GENERAL EXAM/CONSTITUTIONAL: Vitals:  Vitals:   10/09/17 1241  BP: 127/64  Pulse: 69  Weight: 263 lb 12.8 oz (119.7 kg)  Height: 5\' 6"  (1.676 m)   Body mass index is 42.58 kg/m.  Visual Acuity Screening   Right eye Left eye Both eyes  Without correction: 20/100 20/200   With correction:       Patient is in no distress; well developed, nourished and groomed; neck is supple  CARDIOVASCULAR:  Examination of carotid arteries is normal; no carotid bruits  Regular rate and rhythm, no murmurs  Examination of peripheral vascular system by observation and palpation is normal  EYES:  Ophthalmoscopic exam of optic discs and posterior segments is normal; no papilledema or hemorrhages  MUSCULOSKELETAL:  Gait, strength, tone, movements noted in Neurologic exam below  NEUROLOGIC: MENTAL STATUS:  No flowsheet data found.  awake, alert, oriented to person, place and time  recent and remote memory intact  normal attention and concentration  language fluent, comprehension intact, naming intact,   fund of knowledge appropriate  CRANIAL NERVE:   2nd - no papilledema on fundoscopic exam  2nd, 3rd, 4th, 6th - pupils equal and reactive to light, visual fields full to confrontation, extraocular muscles intact, no nystagmus  5th - facial sensation symmetric  7th - facial strength symmetric  8th - hearing intact  9th - palate elevates symmetrically, uvula midline  11th - shoulder shrug symmetric  12th - tongue protrusion midline  MOTOR:   normal bulk  and tone, full strength in the BUE, BLE  SENSORY:   normal and symmetric to light touch, temperature, vibration  COORDINATION:   finger-nose-finger, fine finger movements normal  REFLEXES:   deep tendon reflexes present and symmetric  GAIT/STATION:   narrow based gait; romberg is negative    DIAGNOSTIC DATA (LABS, IMAGING, TESTING) - I reviewed patient records, labs, notes, testing and imaging myself where available.  Lab Results  Component Value Date   WBC 6.9 06/17/2012   HGB 16.3 (H) 10/07/2017   HCT 48.0 (H) 10/07/2017   MCV 87.1 06/17/2012   PLT 211 06/17/2012      Component Value Date/Time   NA 143 10/07/2017 1059   K 3.9 10/07/2017 1059   CL 101 10/07/2017 1059   CO2 32 06/17/2012 0512  GLUCOSE 112 (H) 10/07/2017 1059   BUN 18 10/07/2017 1059   CREATININE 0.70 10/07/2017 1059   CALCIUM 8.9 06/17/2012 0512   PROT 7.1 01/19/2015 0936   ALBUMIN 4.0 01/19/2015 0936   AST 52 (H) 01/19/2015 0936   ALT 78 (H) 01/19/2015 0936   ALKPHOS 58 01/19/2015 0936   BILITOT 0.8 01/19/2015 0936   GFRNONAA >90 06/17/2012 0512   GFRAA >90 06/17/2012 0512   No results found for: CHOL, HDL, LDLCALC, LDLDIRECT, TRIG, CHOLHDL No results found for: HGBA1C No results found for: VITAMINB12 No results found for: TSH   10/02/17 CT head [I reviewed images myself and agree with interpretation. -VRP]  - Negative for acute hemorrhage. - Focal low-density along the left parietal cortex. Findings are suggestive for encephalomalacia and probably represent an old cortical infarct. If there is concern for an acute infarct, recommend further characterization with MRI.  10/07/17 MRI brain [I reviewed images myself and agree with interpretation. -VRP]  - Subacute infarct left lower midbrain/upper pons No associated hemorrhage - Chronic infarct left parietal cortex  10/07/17 carotid u/s 1. Mild plaque at the level of the left carotid bulb and proximal ICA. Estimated left ICA stenosis is  less than 50%. 2. No evidence of right-sided carotid plaque or stenosis in the neck. 3. Incidental left thyroid nodule measuring approximately 3.5 cm in greatest diameter. Recommend correlation with elective complete thyroid ultrasound for further characterization and examination of the entire thyroid gland.     ASSESSMENT AND PLAN  66 y.o. year old female here with new left pontine ischemic infarction, possibly due to small vessel thrombosis.  However chronic left parietal ischemic infarct also noted on imaging.  This raises possibility of cardioembolic source.  Will proceed with further workup.   Dx:  1. Cerebrovascular accident (CVA) due to embolism of other cerebral artery (HCC)      PLAN:  - check MRA head  - follow up TTE (already ordered) - check 30 day heart monitor; if negative, then implanted loop recorder - check sleep study (stroke, HTN, daytime sleepiness, snoring, BMI 42.6) - continue aspirin 325mg  daily, BP control, statin, DM screening (per PCP) - consider PT/OT evaluation - stroke warning signs and education reviewed  Orders Placed This Encounter  Procedures  . MR MRA HEAD WO CONTRAST  . Ambulatory referral to Sleep Studies  . Cardiac event monitor   Return in about 3 months (around 01/09/2018) for with NP or Aroush Chasse.  I reviewed images, labs, notes, records myself. I summarized findings and reviewed with patient, for this high risk condition (stroke) requiring high complexity decision making.     Penni Bombard, MD 18/29/9371, 6:96 PM Certified in Neurology, Neurophysiology and Neuroimaging  [facility] [address] [place], [place] [address] [phone]

## 2017-10-09 NOTE — Patient Instructions (Signed)
Thank you for coming to see [place] at [facility]. I hope we have been able to provide you high quality care today.  You may receive a patient satisfaction survey over the next few weeks. We would appreciate your feedback and comments so that we may continue to improve ourselves and the health of our patients.  - check MRA head   - follow up TTE (echocardiogram)  - check 30 day heart monitor  - continue aspirin 345m daily and blood pressure control  - consider PT/OT evaluation    ~~~~~~~~~~~~~~~~~~~~~~~~~~~~~~~~~~~~~~~~~~~~~~~~~~~~~~~~~~~~~~~~~  DR. Corrie Reder'S GUIDE TO HAPPY AND HEALTHY LIVING These are some of my general health and wellness recommendations. Some of them may apply to you better than others. Please use common sense as you try these suggestions and feel free to ask me any questions.   ACTIVITY/FITNESS Mental, social, emotional and physical stimulation are very important for brain and body health. Try learning a new activity (arts, music, language, sports, games).  Keep moving your body to the best of your abilities. You can do this at home, inside or outside, the park, community center, gym or anywhere you like. Consider a physical therapist or personal trainer to get started. Consider the app Sworkit. Fitness trackers such as smart-watches, smart-phones or Fitbits can help as well.   NUTRITION Eat more plants: colorful vegetables, nuts, seeds and berries.  Eat less sugar, salt, preservatives and processed foods.  Avoid toxins such as cigarettes and alcohol.  Drink water when you are thirsty. Warm water with a slice of lemon is an excellent morning drink to start the day.  Consider these websites for more information The Nutrition [employer] (hhttps://www.henry-hernandez.biz/ [employer] Nutrition (wWindowBlog.ch   RELAXATION Consider practicing mindfulness meditation or other relaxation techniques such as  deep breathing, prayer, yoga, tai chi, massage. See website mindful.org or the apps Headspace or Calm to help get started.   SLEEP Try to get at least 7-8+ hours sleep per day. Regular exercise and reduced caffeine will help you sleep better. Practice good sleep hygeine techniques. See website sleep.org for more information.   PLANNING Prepare estate planning, living will, healthcare POA documents. Sometimes this is best planned with the help of an attorney. Theconversationproject.org and agingwithdignity.org are excellent resources.

## 2017-10-10 ENCOUNTER — Ambulatory Visit (HOSPITAL_COMMUNITY)
Admission: RE | Admit: 2017-10-10 | Discharge: 2017-10-10 | Disposition: A | Discharge status: Home / Self Care | Payer: Medicare Other | Source: Ambulatory Visit | Attending: Internal Medicine | Admitting: Internal Medicine

## 2017-10-10 ENCOUNTER — Other Ambulatory Visit (HOSPITAL_COMMUNITY): Payer: Self-pay | Admitting: Internal Medicine

## 2017-10-10 DIAGNOSIS — I119 Hypertensive heart disease without heart failure: Secondary | ICD-10-CM | POA: Insufficient documentation

## 2017-10-10 DIAGNOSIS — R42 Dizziness and giddiness: Secondary | ICD-10-CM | POA: Insufficient documentation

## 2017-10-10 DIAGNOSIS — H538 Other visual disturbances: Secondary | ICD-10-CM | POA: Diagnosis not present

## 2017-10-10 DIAGNOSIS — I080 Rheumatic disorders of both mitral and aortic valves: Secondary | ICD-10-CM | POA: Insufficient documentation

## 2017-10-10 DIAGNOSIS — E041 Nontoxic single thyroid nodule: Secondary | ICD-10-CM

## 2017-10-10 LAB — ECHOCARDIOGRAM COMPLETE
AVLVOTPG: 4 mmHg
CHL CUP RV SYS PRESS: 21 mmHg
CHL CUP STROKE VOLUME: 43 mL
E decel time: 268 msec
EERAT: 10.1
FS: 41 % (ref 28–44)
IVS/LV PW RATIO, ED: 1.13
LA diam index: 2.02 cm/m2
LA vol A4C: 57 ml
LASIZE: 49 mm
LAVOL: 48.6 mL
LAVOLIN: 20.1 mL/m2
LEFT ATRIUM END SYS DIAM: 49 mm
LV E/e' medial: 10.1
LV dias vol: 67 mL (ref 46–106)
LV e' LATERAL: 8.05 cm/s
LV sys vol index: 10 mL/m2
LVDIAVOLIN: 28 mL/m2
LVEEAVG: 10.1
LVOT VTI: 29 cm
LVOT area: 3.14 cm2
LVOT diameter: 20 mm
LVOT peak vel: 99.4 cm/s
LVOTSV: 91 mL
LVSYSVOL: 24 mL
Lateral S' vel: 18.8 cm/s
MV Dec: 268
MV pk A vel: 99.4 m/s
MVPG: 3 mmHg
MVPKEVEL: 81.3 m/s
PW: 11 mm — AB (ref 0.6–1.1)
RV TAPSE: 18 mm
Reg peak vel: 179 cm/s
Simpson's disk: 64
TDI e' lateral: 8.05
TDI e' medial: 5.77
TR max vel: 179 cm/s

## 2017-10-10 NOTE — Progress Notes (Signed)
*  PRELIMINARY RESULTS* Echocardiogram 2D Echocardiogram has been performed.  Samuel Germany 10/10/2017, 10:31 AM

## 2017-10-15 ENCOUNTER — Ambulatory Visit (HOSPITAL_COMMUNITY): Admission: RE | Admit: 2017-10-15 | Payer: Medicare Other | Source: Ambulatory Visit

## 2017-10-16 ENCOUNTER — Ambulatory Visit (HOSPITAL_COMMUNITY)
Admission: RE | Admit: 2017-10-16 | Discharge: 2017-10-16 | Disposition: A | Discharge status: Home / Self Care | Payer: Medicare Other | Source: Ambulatory Visit | Attending: Internal Medicine | Admitting: Internal Medicine

## 2017-10-16 DIAGNOSIS — E042 Nontoxic multinodular goiter: Secondary | ICD-10-CM | POA: Diagnosis not present

## 2017-10-16 DIAGNOSIS — E041 Nontoxic single thyroid nodule: Secondary | ICD-10-CM

## 2017-10-17 ENCOUNTER — Ambulatory Visit (HOSPITAL_COMMUNITY)
Admission: RE | Admit: 2017-10-17 | Discharge: 2017-10-17 | Disposition: A | Discharge status: Home / Self Care | Payer: Medicare Other | Source: Ambulatory Visit | Attending: Diagnostic Neuroimaging | Admitting: Diagnostic Neuroimaging

## 2017-10-17 DIAGNOSIS — I663 Occlusion and stenosis of cerebellar arteries: Secondary | ICD-10-CM | POA: Diagnosis not present

## 2017-10-17 DIAGNOSIS — I63449 Cerebral infarction due to embolism of unspecified cerebellar artery: Secondary | ICD-10-CM | POA: Insufficient documentation

## 2017-10-17 DIAGNOSIS — I6349 Cerebral infarction due to embolism of other cerebral artery: Secondary | ICD-10-CM

## 2017-10-17 DIAGNOSIS — I639 Cerebral infarction, unspecified: Secondary | ICD-10-CM | POA: Diagnosis not present

## 2017-10-17 DIAGNOSIS — I672 Cerebral atherosclerosis: Secondary | ICD-10-CM | POA: Insufficient documentation

## 2017-10-23 ENCOUNTER — Other Ambulatory Visit (HOSPITAL_COMMUNITY): Payer: Self-pay | Admitting: Internal Medicine

## 2017-10-23 DIAGNOSIS — E041 Nontoxic single thyroid nodule: Secondary | ICD-10-CM

## 2017-10-28 ENCOUNTER — Ambulatory Visit (INDEPENDENT_AMBULATORY_CARE_PROVIDER_SITE_OTHER): Payer: Medicare Other | Admitting: Neurology

## 2017-10-28 ENCOUNTER — Encounter: Payer: Self-pay | Admitting: Neurology

## 2017-10-28 VITALS — BP 141/87 | HR 88 | Ht 66.0 in | Wt 262.0 lb

## 2017-10-28 DIAGNOSIS — R51 Headache: Secondary | ICD-10-CM | POA: Diagnosis not present

## 2017-10-28 DIAGNOSIS — R351 Nocturia: Secondary | ICD-10-CM

## 2017-10-28 DIAGNOSIS — R519 Headache, unspecified: Secondary | ICD-10-CM

## 2017-10-28 DIAGNOSIS — Z6841 Body Mass Index (BMI) 40.0 and over, adult: Secondary | ICD-10-CM | POA: Diagnosis not present

## 2017-10-28 DIAGNOSIS — I639 Cerebral infarction, unspecified: Secondary | ICD-10-CM

## 2017-10-28 DIAGNOSIS — I6350 Cerebral infarction due to unspecified occlusion or stenosis of unspecified cerebral artery: Secondary | ICD-10-CM

## 2017-10-28 DIAGNOSIS — R0683 Snoring: Secondary | ICD-10-CM | POA: Diagnosis not present

## 2017-10-28 NOTE — Patient Instructions (Addendum)
Thank you for choosing [facility] for your sleep related care! It was nice to meet you today! I appreciate that you entrust me with your sleep related healthcare concerns. I hope, I was able to address at least some of your concerns today, and that I can help you feel reassured and also get better.    Here is what we discussed today and what we came up with as our plan for you:    Based on your symptoms and your exam I believe you are at risk for obstructive sleep apnea or OSA, and I think we should proceed with a sleep study to determine whether you do or do not have OSA and how severe it is. If you have more than mild OSA, I want you to consider treatment with CPAP. Please remember, the risks and ramifications of moderate to severe obstructive sleep apnea or OSA are: Cardiovascular disease, including congestive heart failure, stroke, difficult to control hypertension, arrhythmias, and even type 2 diabetes has been linked to untreated OSA. Sleep apnea causes disruption of sleep and sleep deprivation in most cases, which, in turn, can cause recurrent headaches, problems with memory, mood, concentration, focus, and vigilance. Most people with untreated sleep apnea report excessive daytime sleepiness, which can affect their ability to drive. Please do not drive if you feel sleepy.   I will likely see you back after your sleep study to go over the test results and where to go from there. We will call you after your sleep study to advise about the results (most likely, you will hear from [place], my nurse) and to set up an appointment at the time, as necessary.    We will wait till [place] sleep lab administrative assistant will call you to schedule your sleep study. If you don't hear back from her by about 2 weeks from now, please feel free to call her at [phone]. You can leave a message with your phone number and concerns, if you get the voicemail box. She will call back as soon as  possible.

## 2017-10-28 NOTE — Progress Notes (Signed)
Subjective:    Patient ID: Heidi Rhodes is a 66 y.o. female.  HPI    Star Age, MD, PhD [facility] [address], [address] [place], [place] [address]  Dear [name],   I saw your patient, Heidi Rhodes, upon your kind request in my clinic today for initial consultation of her sleep disorder, in particular, concern for underlying obstructive sleep apnea. The patient is accompanied by her husband today. As you know, 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 I reviewed your office note from 10/09/2017. She is a nonsmoker and does not utilize alcohol. She drinks caffeine in the form of coffee, usually one cup per day. She is married and lives with her husband. They have 3 children. She is retired. She feels almost back to baseline. Her ESS is 4/24, and fatigue score of 11/63.  She has nocturia, about 1-2 times, rare AM HAs. No Hx of migraines. Has been found to have thyroid nodule. Bedtime is around 10 PM and WT is around 6 AM. She has some residual R sided weakness and maybe some speech issues. She has otherwise come along well. She denies RLS symptoms. She is not aware of any family history of OSA. She is a retired Programmer, multimedia. She would be willing to undergo a sleep study but would like to wait until her new insurance kicks in after the first of the year.  Her Past Medical History Is Significant For: Past Medical History:  Diagnosis Date  . Anxiety    about being put to sleep for  surgery  . Arthritis   . Gallbladder disorder   . High cholesterol   . HTN  (hypertension)     Her Past Surgical History Is Significant For: Past Surgical History:  Procedure Laterality Date  . APPENDECTOMY    . ceasarean section    . CHOLECYSTECTOMY  06/16/2012   Procedure: LAPAROSCOPIC CHOLECYSTECTOMY;  Surgeon: Scherry Ran, MD;  Location: [facility];  Service: General;  Laterality: N/A;  . COLONOSCOPY  02/13/2012   Procedure: COLONOSCOPY;  Surgeon: Rogene Houston, MD;  Location: [facility];  Service: Endoscopy;  Laterality: N/A;  930 possibly earlier for antibiotics  . Left knee replacement     2012  . TOTAL KNEE ARTHROPLASTY  04/18/2012   Procedure: TOTAL KNEE ARTHROPLASTY;  Surgeon: Yvette Rack., MD;  Location: [facility];  Service: Orthopedics;  Laterality: Right;  with revison tibia    Her Family History Is Significant For: Family History  Problem Relation Age of Onset  . Heart attack Father   . Anesthesia problems Neg Hx     Her Social History Is Significant For: Social History   Socioeconomic History  . Marital status: Married    Spouse name: None  . Number of children: None  . Years of education: None  . Highest education level: None  Social Needs  . Financial resource strain: None  . Food insecurity - worry: None  . Food insecurity - inability: None  . Transportation needs - medical: None  . Transportation needs - non-medical: None  Occupational History  . None  Tobacco Use  .  Smoking status: Never Smoker  . Smokeless tobacco: Never Used  Substance and Sexual Activity  . Alcohol use: No  . Drug use: No  . Sexual activity: Yes    Birth control/protection: None  Other Topics Concern  . None  Social History Narrative   Lives at home with husband.      Her Allergies Are:  No Known Allergies:   Her Current Medications Are:  Outpatient Encounter Medications as of 10/28/2017  Medication Sig  . amLODipine (NORVASC) 5 MG tablet Take 5 mg by mouth daily.  Marland Kitchen aspirin 325 MG tablet Take 325 mg by mouth daily.  .  hydrochlorothiazide (HYDRODIURIL) 25 MG tablet Take 25 mg by mouth daily.  Marland Kitchen lisinopril (PRINIVIL,ZESTRIL) 10 MG tablet Take 10 mg by mouth daily.  . Multiple Vitamin (MULTIVITAMIN) tablet Take 1 tablet by mouth daily.  . Multiple Vitamins-Minerals (AIRBORNE PO) Take by mouth every morning.  . Pancrelipase, Lip-Prot-Amyl, (ZENPEP PO) Take by mouth. 20,000cm daily  . Probiotic Product (PROBIOTIC DAILY PO) Take by mouth.   No facility-administered encounter medications on file as of 10/28/2017.   :  Review of Systems:  Out of a complete 14 point review of systems, all are reviewed and negative with the exception of these symptoms as listed below: Review of Systems  Neurological:       Pt presents today to discuss her sleep. Pt has never had a sleep study but does endorse snoring.  Epworth Sleepiness Scale 0= would never doze 1= slight chance of dozing 2= moderate chance of dozing 3= high chance of dozing  Sitting and reading: 0 Watching TV: 2 Sitting inactive in a public place (ex. Theater or meeting): 0 As a passenger in a car for an hour without a break: 0 Lying down to rest in the afternoon: 0 Sitting and talking to someone: 0 Sitting quietly after lunch (no alcohol): 2 In a car, while stopped in traffic: 0 Total: 4     Objective:  Neurological Exam  Physical Exam Physical Examination:   Vitals:   10/28/17 1326  BP: (!) 141/87  Pulse: 88    General Examination: The patient is a very pleasant 66 y.o. female in no acute distress. She appears well-developed and well-nourished and well groomed.   HEENT: Normocephalic, atraumatic, pupils are equal, round and reactive to light and accommodation. She has corrective eyeglasses. Extraocular tracking is good without limitation to gaze excursion or nystagmus noted. Normal smooth pursuit is noted. Hearing is grossly intact. Face is symmetric with normal facial animation and normal facial sensation. Speech is clear with no  dysarthria noted, minimal word finding difficulty noted. Airway examination reveals no significant mouth dryness, adequate dental hygiene, mild airway crowding secondary to redundant soft palate and longer uvula. Tonsils are 1+ bilaterally. She has a minimal overbite. Tongue and palate are symmetrical. Neck circumference is 16-5/8 inches.  Chest: Clear to auscultation without wheezing, rhonchi or crackles noted.  Heart: S1+S2+0, regular and normal without murmurs, rubs or gallops noted.   Abdomen: Soft, non-tender and non-distended with normal bowel sounds appreciated on auscultation.  Extremities: There is trace edema right distal lower extremity and 1+ edema left distal lower extremity.  Skin: Warm and dry without trophic changes noted.  Musculoskeletal: exam reveals no obvious joint deformities, tenderness or joint swelling or erythema, s/p b/l TKA.   Neurologically:  Mental status: The patient is awake, alert and oriented in all 4 spheres. Her immediate and remote memory, attention, language skills and fund of  knowledge are appropriate. There is no evidence of aphasia, agnosia, apraxia or anomia. Speech is clear with normal prosody and enunciation. Thought process is linear. Mood is normal and affect is normal.  Cranial nerves II - XII are as described above under HEENT exam. In addition: shoulder shrug is normal with equal shoulder height noted. Motor exam: Normal bulk, strength and tone is noted. May have subtle right grip strength weakness and right hip flexion weakness. Reflexes are 1+ in UEs. Fine motor skills and coordination: intact with normal finger taps, normal hand movements, normal rapid alternating patting, normal foot taps and normal foot agility.  Cerebellar testing: No dysmetria or intention tremor on finger to nose testing.  Sensory exam: intact to light touch in the upper and lower extremities.  Gait, station and balance: She stands with minimal difficulty. No veering to one  side is noted. No leaning to one side is noted. Posture is age-appropriate and stance is somewhat wider based. No limp.   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 I had a long chat with the patient and her husband about my findings and the diagnosis of OSA, its prognosis and treatment options. We talked about medical treatments, surgical interventions and non-pharmacological approaches. I explained in particular the risks and ramifications of untreated moderate to severe OSA, especially with respect to developing cardiovascular disease down the Road, including congestive heart failure, difficult to treat hypertension, cardiac arrhythmias, or stroke. Even type 2 diabetes has, in part, been linked to untreated OSA. Symptoms of untreated OSA include daytime sleepiness, memory problems, mood irritability and mood disorder such as depression and anxiety, lack of energy, as well as recurrent headaches, especially morning headaches. We talked about trying to maintain a healthy lifestyle in general, as well as the importance of weight control. I encouraged the patient to eat healthy, exercise daily and keep well hydrated, to keep a scheduled bedtime and wake time routine, to not skip any meals and eat healthy snacks in between meals. I advised the patient not to drive when feeling sleepy. I recommended the following at this time: sleep study with potential positive airway pressure titration. (We will score hypopneas at 4%). As per her request, we will wait  until her new insurance kicks in after the first of year. Of note, patient is scheduled for a fine-needle aspiration of her thyroid for 10/30/2017. She is supposed to have a 30 day heart monitor which has not yet been set up. She is wondering about the test which looks like should be through [facility]. Please look into this.   I explained the sleep test procedure to the patient and also outlined possible surgical and non-surgical treatment options of OSA, including the use of a custom-made dental device (which would require a referral to a specialist dentist or oral surgeon), upper airway surgical options, such as pillar implants, radiofrequency surgery, tongue base surgery, and UPPP (which would involve a referral to an ENT surgeon). Rarely, jaw surgery such as mandibular advancement may be considered.  I also explained the CPAP treatment option to the patient, who  indicated that she would be willing to try CPAP if the need arises. I explained the importance of being compliant with PAP treatment, not only for insurance purposes but primarily to improve Her symptoms, and for the patient's long term health benefit, including to reduce Her cardiovascular risks. I answered all their questions today and the patient and her husband were in agreement. I will likely see her back after the sleep study is completed and encouraged them to call with any interim questions, concerns, problems or updates.   Thank you very much for allowing me to participate in the care of this nice patient. If I can be of any further assistance to you please do not hesitate to talk to me.  Sincerely,   Star Age, MD, PhD

## 2017-10-30 ENCOUNTER — Ambulatory Visit (HOSPITAL_COMMUNITY)
Admission: RE | Admit: 2017-10-30 | Discharge: 2017-10-30 | Disposition: A | Discharge status: Home / Self Care | Payer: Medicare Other | Source: Ambulatory Visit | Attending: Internal Medicine | Admitting: Internal Medicine

## 2017-10-30 DIAGNOSIS — E041 Nontoxic single thyroid nodule: Secondary | ICD-10-CM | POA: Insufficient documentation

## 2017-10-30 DIAGNOSIS — D440 Neoplasm of uncertain behavior of thyroid gland: Secondary | ICD-10-CM | POA: Diagnosis not present

## 2017-10-30 MED ORDER — LIDOCAINE HCL (PF) 2 % IJ SOLN
INTRAMUSCULAR | Status: AC
  Administered 2017-10-30: 5 mL
  Filled 2017-10-30: qty 20

## 2017-10-30 NOTE — Discharge Instructions (Signed)
Thyroid Biopsy °The thyroid gland is a butterfly-shaped gland located in the front of the neck. It produces hormones that affect metabolism, growth and development, and body temperature. Thyroid biopsy is a procedure in which small samples of tissue or fluid are removed from the thyroid gland. The samples are then looked at under a microscope to check for abnormalities. This procedure is done to determine the cause of thyroid problems. It may be done to check for infection, cancer, or other thyroid problems. °Two methods may be used for a thyroid biopsy. In one method, a thin needle is inserted through the skin and into the thyroid gland. In the other method, an open incision is made through the skin. °Tell a health care provider about: °· Any allergies you have. °· All medicines you are taking, including vitamins, herbs, eye drops, creams, and over-the-counter medicines. °· Any problems you or family members have had with anesthetic medicines. °· Any blood disorders you have. °· Any surgeries you have had. °· Any medical conditions you have. °What are the risks? °Generally, this is a safe procedure. However, problems can occur and include: °· Bleeding from the procedure site. °· Infection. °· Injury to structures near the thyroid gland. ° °What happens before the procedure? °· Ask your health care provider about: °? Changing or stopping your regular medicines. This is especially important if you are taking diabetes medicines or blood thinners. °? Taking medicines such as aspirin and ibuprofen. These medicines can thin your blood. Do not take these medicines before your procedure if your health care provider asks you not to. °· Do not eat or drink anything after midnight on the night before the procedure or as directed by your health care provider. °· You may have a blood sample taken. °What happens during the procedure? °Either of these methods may be used to perform a thyroid biopsy: °· Fine needle biopsy. You may  be given medicine to help you relax (sedative). You will be asked to lie on your back with your head tipped backward to extend your neck. An area on your neck will be cleaned. A needle will then be inserted through the skin of your neck. You may be asked to avoid coughing, talking, swallowing, or making sounds during some portions of the procedure. The needle will be withdrawn once the tissue or fluid samples have been removed. Pressure may be applied to your neck to reduce swelling and ensure that bleeding has stopped. The samples will be sent to a lab for examination. °· Open biopsy. You will be given medicine to make you sleep (general anesthetic). An incision will be made in your neck. A sample of thyroid tissue will be removed using surgical tools. The tissue sample will be sent for examination. In some cases, the sample may be examined during the biopsy. If that is done and cancer cells are found, some or all of the thyroid gland may be removed. The incision will be closed with stitches. ° °What happens after the procedure? °· Your recovery will be assessed and monitored. °· You may have soreness and tenderness at the site of the biopsy. This should go away after a few days. °· If you had an open biopsy, you may have a hoarse voice or sore throat for a couple days. °· It is your responsibility to get your test results. °This information is not intended to replace advice given to you by your health care provider. Make sure you discuss any questions you have with   your health care provider. °Document Released: 08/26/2007 Document Revised: 07/01/2016 Document Reviewed: 01/21/2014 °Elsevier Interactive Patient Education © 2018 Elsevier Inc. ° °

## 2017-11-06 ENCOUNTER — Encounter (INDEPENDENT_AMBULATORY_CARE_PROVIDER_SITE_OTHER): Payer: Medicare Other

## 2017-11-06 DIAGNOSIS — I6349 Cerebral infarction due to embolism of other cerebral artery: Secondary | ICD-10-CM | POA: Diagnosis not present

## 2017-12-02 ENCOUNTER — Other Ambulatory Visit: Payer: Self-pay | Admitting: Surgery

## 2017-12-02 DIAGNOSIS — D440 Neoplasm of uncertain behavior of thyroid gland: Secondary | ICD-10-CM

## 2017-12-08 ENCOUNTER — Ambulatory Visit (INDEPENDENT_AMBULATORY_CARE_PROVIDER_SITE_OTHER): Payer: Medicare Other | Admitting: Neurology

## 2017-12-08 DIAGNOSIS — Z6841 Body Mass Index (BMI) 40.0 and over, adult: Secondary | ICD-10-CM

## 2017-12-08 DIAGNOSIS — I6350 Cerebral infarction due to unspecified occlusion or stenosis of unspecified cerebral artery: Secondary | ICD-10-CM

## 2017-12-08 DIAGNOSIS — G4720 Circadian rhythm sleep disorder, unspecified type: Secondary | ICD-10-CM

## 2017-12-08 DIAGNOSIS — I639 Cerebral infarction, unspecified: Secondary | ICD-10-CM

## 2017-12-08 DIAGNOSIS — R0683 Snoring: Secondary | ICD-10-CM

## 2017-12-08 DIAGNOSIS — R519 Headache, unspecified: Secondary | ICD-10-CM

## 2017-12-08 DIAGNOSIS — R351 Nocturia: Secondary | ICD-10-CM

## 2017-12-08 DIAGNOSIS — G4733 Obstructive sleep apnea (adult) (pediatric): Secondary | ICD-10-CM

## 2017-12-08 DIAGNOSIS — R51 Headache: Secondary | ICD-10-CM

## 2017-12-10 ENCOUNTER — Other Ambulatory Visit: Payer: Self-pay | Admitting: Neurology

## 2017-12-10 ENCOUNTER — Telehealth: Payer: Self-pay

## 2017-12-10 ENCOUNTER — Telehealth: Payer: Self-pay | Admitting: *Deleted

## 2017-12-10 DIAGNOSIS — G4733 Obstructive sleep apnea (adult) (pediatric): Secondary | ICD-10-CM

## 2017-12-10 DIAGNOSIS — Z6841 Body Mass Index (BMI) 40.0 and over, adult: Secondary | ICD-10-CM

## 2017-12-10 DIAGNOSIS — Z8673 Personal history of transient ischemic attack (TIA), and cerebral infarction without residual deficits: Secondary | ICD-10-CM

## 2017-12-10 DIAGNOSIS — G4720 Circadian rhythm sleep disorder, unspecified type: Secondary | ICD-10-CM

## 2017-12-10 NOTE — Progress Notes (Signed)
Patient referred by Dr. Leta Baptist, seen by me on 10/28/17, diagnostic PSG on 12/08/17.   Please call and notify the patient that the recent sleep study showed moderated obstructive sleep apnea. I recommend treatment for this in the form of CPAP. This will require a repeat sleep study for proper titration and mask fitting and correct monitoring of the oxygen saturations. Please explain to patient. I have placed an order in the chart. Thanks.  Star Age, MD, PhD [facility])

## 2017-12-10 NOTE — Procedures (Signed)
PATIENT'S NAME:  Heidi Rhodes, Heidi Rhodes DOB:      Mar 30, 1951      MR#:    [id number]     DATE OF RECORDING: 12/08/2017 REFERRING M.D.:  Marylene Land Study Performed:   Baseline Polysomnogram HISTORY: 67 year old woman with a history of hypertension, hyperlipidemia, strokes, anxiety, arthritis with status post bilateral total knee replacement surgeries, and morbid obesity, who reports snoring and sleep disruption. The patient endorsed the Epworth Sleepiness Scale at 4/24 points. The patient's weight 262 pounds with a height of 66 (inches), resulting in a BMI of 42.2 kg/m2. The patient's neck circumference measured 16.5 inches.   CURRENT MEDICATIONS: Amlodipine, Aspirin, Hydrochlorothiazide, Lisinopril, Multi-Vitamin, Pancrelipase and Probiotic.   PROCEDURE:  This is a multichannel digital polysomnogram utilizing the Somnostar 11.2 system.  Electrodes and sensors were applied and monitored per AASM Specifications.   EEG, EOG, Chin and Limb EMG, were sampled at 200 Hz.  ECG, Snore and Nasal Pressure, Thermal Airflow, Respiratory Effort, CPAP Flow and Pressure, Oximetry was sampled at 50 Hz. Digital video and audio were recorded.      BASELINE STUDY  Lights Out was at 21:08 and Lights On at 05:00.  Total recording time (TRT) was 473 minutes, with a total sleep time (TST) of  305.5 minutes.   The patient's sleep latency was 210 minutes, which is markedly delayed.  REM latency was 156 minutes, which is delayed. The sleep efficiency was 64.6%, which is reduced.     SLEEP ARCHITECTURE: WASO (Wake after sleep onset) was 57.5 minutes.  There were 16.5 minutes in Stage N1, 231.5 minutes Stage N2, 0 minutes Stage N3 and 57.5 minutes in Stage REM.  The percentage of Stage N1 was 5.4%, Stage N2 was 75.8%, which is markedly increased, Stage N3 was absent and Stage R (REM sleep) was 18.8%. The arousals were noted as: 30 were spontaneous, 0 were associated with PLMs, 103 were associated with respiratory  events.  Audio and video analysis did not show any abnormal or unusual movements, behaviors, phonations or vocalizations. The patient took 1 bathroom break. Moderate snoring was noted. The EKG was in keeping with normal sinus rhythm (NSR).  RESPIRATORY ANALYSIS:  There were a total of 104 respiratory events:  11 obstructive apneas, 0 central apneas and 0 mixed apneas with a total of 11 apneas and an apnea index (AI) of 2.2 /hour. There were 93 hypopneas with a hypopnea index of 18.3 /hour. The patient also had 0 respiratory event related arousals (RERAs).      The total APNEA/HYPOPNEA INDEX (AHI) was 20.4/hour and the total RESPIRATORY DISTURBANCE INDEX was 20.4 /hour.  27 events occurred in REM sleep and 142 events in NREM. The REM AHI was 28.2 /hour, versus a non-REM AHI of 18.6. The patient spent 305.5 minutes of total sleep time in the supine position and 0 minutes in non-supine.. The supine AHI was 20.5 versus a non-supine AHI of 0.0.  OXYGEN SATURATION & C02:  The Wake baseline 02 saturation was 98%, with the lowest being 84%. Time spent below 89% saturation equaled 19 minutes.  PERIODIC LIMB MOVEMENTS: The patient had a total of 0 Periodic Limb Movements.  The Periodic Limb Movement (PLM) index was 0 and the PLM Arousal index was 0/hour.  Post-study, the patient indicated that sleep was worse than usual.   IMPRESSION:  1. Obstructive Sleep Apnea (OSA) 2. Dysfunctions associated with sleep stages or arousal from sleep  RECOMMENDATIONS:  1. This study demonstrates moderate obstructive sleep apnea, with a  total AHI of 20.4/hour, REM AHI of 28.2/hour, supine AHI of 20.5/hour and O2 nadir of 84%. Treatment with positive airway pressure in the form of CPAP is recommended. This will require a full night titration study to optimize therapy. Other treatment options may include avoidance of supine sleep position along with weight loss, upper airway or jaw surgery in selected patients or the use of  an oral appliance in certain patients. ENT evaluation and/or consultation with a maxillofacial surgeon or dentist may be feasible in some instances.    2. Please note that untreated obstructive sleep apnea carries additional perioperative morbidity. Patients with significant obstructive sleep apnea should receive perioperative PAP therapy and the surgeons and particularly the anesthesiologist should be informed of the diagnosis and the severity of the sleep disordered breathing. 3. This study shows sleep fragmentation and abnormal sleep stage percentages; these are nonspecific findings and per se do not signify an intrinsic sleep disorder or a cause for the patient's sleep-related symptoms. Causes include (but are not limited to) the first night effect of the sleep study, circadian rhythm disturbances, medication effect or an underlying mood disorder or medical problem.  4. The patient should be cautioned not to drive, work at heights, or operate dangerous or heavy equipment when tired or sleepy. Review and reiteration of good sleep hygiene measures should be pursued with any patient. 5. The patient will be seen in follow-up by Dr. Rexene Alberts at [facility] for discussion of the test results and further management strategies. The referring provider will be notified of the test results.  I certify that I have reviewed the entire raw data recording prior to the issuance of this report in accordance with the Standards of Accreditation of the American Academy of Sleep Medicine (AASM)   Star Age, MD, PhD Diplomat, American Board of Psychiatry and Neurology (Neurology and Sleep Medicine)

## 2017-12-10 NOTE — Telephone Encounter (Signed)
I called pt. I advised pt that Dr. Rexene Alberts reviewed their sleep study results and found that pt has moderate osa and recommends that pt be treated with a cpap. Dr. Rexene Alberts recommends that pt return for a repeat sleep study in order to properly titrate the cpap and ensure a good mask fit. Pt is agreeable to returning for a titration study. I advised pt that our sleep lab will file with pt's insurance and call pt to schedule the sleep study when we hear back from the pt's insurance regarding coverage of this sleep study. Pt verbalized understanding of results. Pt had no questions at this time but was encouraged to call back if questions arise.

## 2017-12-10 NOTE — Telephone Encounter (Signed)
-----   Message from Star Age, MD sent at 12/10/2017  8:27 AM EST ----- Patient referred by Dr. Leta Baptist, seen by me on 10/28/17, diagnostic PSG on 12/08/17.   Please call and notify the patient that the recent sleep study showed moderated obstructive sleep apnea. I recommend treatment for this in the form of CPAP. This will require a repeat sleep study for proper titration and mask fitting and correct monitoring of the oxygen saturations. Please explain to patient. I have placed an order in the chart. Thanks.  Star Age, MD, PhD [facility])

## 2017-12-10 NOTE — Telephone Encounter (Signed)
Spoke with pts husband, Wynonia Lawman (ok per DPR) and let him know that cardiac event monitor results were unremarkable, no major findings.  Continue current plan.  He would relay to her.

## 2017-12-10 NOTE — Telephone Encounter (Signed)
-----   Message from Penni Bombard, MD sent at 12/08/2017  7:59 PM EST ----- Unremarkable study. No major findings. Please call patient. Continue current plan. -VRP

## 2017-12-11 ENCOUNTER — Ambulatory Visit
Admission: RE | Admit: 2017-12-11 | Discharge: 2017-12-11 | Disposition: A | Discharge status: Home / Self Care | Payer: Medicare Other | Source: Ambulatory Visit | Attending: Surgery | Admitting: Surgery

## 2017-12-11 DIAGNOSIS — D440 Neoplasm of uncertain behavior of thyroid gland: Secondary | ICD-10-CM

## 2017-12-24 ENCOUNTER — Encounter (HOSPITAL_COMMUNITY): Payer: Self-pay

## 2018-01-09 ENCOUNTER — Ambulatory Visit (INDEPENDENT_AMBULATORY_CARE_PROVIDER_SITE_OTHER): Payer: Medicare Other | Admitting: Neurology

## 2018-01-09 DIAGNOSIS — Z6841 Body Mass Index (BMI) 40.0 and over, adult: Secondary | ICD-10-CM

## 2018-01-09 DIAGNOSIS — G4720 Circadian rhythm sleep disorder, unspecified type: Secondary | ICD-10-CM

## 2018-01-09 DIAGNOSIS — G4733 Obstructive sleep apnea (adult) (pediatric): Secondary | ICD-10-CM

## 2018-01-09 DIAGNOSIS — Z8673 Personal history of transient ischemic attack (TIA), and cerebral infarction without residual deficits: Secondary | ICD-10-CM

## 2018-01-14 ENCOUNTER — Other Ambulatory Visit: Payer: Self-pay | Admitting: Neurology

## 2018-01-14 ENCOUNTER — Telehealth: Payer: Self-pay

## 2018-01-14 DIAGNOSIS — G4733 Obstructive sleep apnea (adult) (pediatric): Secondary | ICD-10-CM

## 2018-01-14 NOTE — Progress Notes (Signed)
Patient referred by Dr. Leta Baptist, seen by me on 10/28/17, diagnostic PSG on 12/08/17. Patient had a CPAP titration study on 01/09/18.  Please call and inform patient that I have entered an order for treatment with positive airway pressure (PAP) treatment for obstructive sleep apnea (OSA). She did well during the latest sleep study with CPAP. We will, therefore, arrange for a machine for home use through a DME (durable medical equipment) company of Her choice; and I will see the patient back in follow-up in about 10 weeks. Please also explain to the patient that I will be looking out for compliance data, which can be downloaded from the machine (stored on an SD card, that is inserted in the machine) or via remote access through a modem, that is built into the machine. At the time of the followup appointment we will discuss sleep study results and how it is going with PAP treatment at home. Please advise patient to bring Her machine at the time of the first FU visit, even though this is cumbersome. Bringing the machine for every visit after that will likely not be needed, but often helps for the first visit to troubleshoot if needed. Please re-enforce the importance of compliance with treatment and the need for [place] to monitor compliance data - often an insurance requirement and actually good feedback for the patient as far as how they are doing.  Also remind patient, that any interim PAP machine or mask issues should be first addressed with the DME company, as they can often help better with technical and mask fit issues. Please ask if patient has a preference regarding DME company.  Please also make sure, the patient has a follow-up appointment with me in about 10 weeks from the setup date, thanks. May see one of our nurse practitioners if needed for proper timing of the FU appointment.  Please fax or rout report to the referring provider. Thanks,   Star Age, MD, PhD [facility])

## 2018-01-14 NOTE — Procedures (Signed)
PATIENT'S NAME:  Heidi Rhodes, Heidi Rhodes DOB:      Mar 09, 1951      MR#:    [id number]     DATE OF RECORDING: 01/09/2018 REFERRING M.D.:  Marylene Land Study Performed:   CPAP  Titration HISTORY: 67 year old woman with a history of hypertension, hyperlipidemia, strokes, anxiety, arthritis with status post bilateral total knee replacement surgeries, and morbid obesity, who returns for CPAP titration following PSG performed on 12/08/2017, which showed an AHI of 20.4 and low spo2 of 84%. The patient endorsed the Epworth Sleepiness Scale at 4 points, BMI of 42.2 kg/m2. The patient's neck circumference measured 16 inches.  CURRENT MEDICATIONS: Amlodipine, Aspirin, Hydrochlorothiazide, Lisinopril, Multi-Vitamin, Pancrelipase and Probiotic.  PROCEDURE:  This is a multichannel digital polysomnogram utilizing the SomnoStar 11.2 system.  Electrodes and sensors were applied and monitored per AASM Specifications.   EEG, EOG, Chin and Limb EMG, were sampled at 200 Hz.  ECG, Snore and Nasal Pressure, Thermal Airflow, Respiratory Effort, CPAP Flow and Pressure, Oximetry was sampled at 50 Hz. Digital video and audio were recorded.      The patient was fitted with a small Dreamwear FFM. CPAP was initiated at 5 cmH20 with heated humidity per AASM standards and pressure was advanced to 8 cmH20 because of hypopneas, apneas and desaturations.  At a PAP pressure of 8 cmH20, there was a reduction of the AHI to 0 with brief supine REM sleep achieved and O2 nadir of 88%.   Lights Out was at 21:10 and Lights On at 04:59. Total recording time (TRT) was 469 minutes, with a total sleep time (TST) of 344 minutes. The patient's sleep latency was 42 minutes, which is delayed. REM latency was 183.5 minutes, which is delayed. The sleep efficiency was 73.3 %.    SLEEP ARCHITECTURE: WASO (Wake after sleep onset) was 27 minutes, with mild sleep fragmentation noted.  There were 12.5 minutes in Stage N1, 59 minutes Stage N2, 236 minutes  Stage N3 and 36.5 minutes in Stage REM.  The percentage of Stage N1 was 3.6%, Stage N2 was 17.2%, Stage N3 was 68.6%, which is increased, and Stage R (REM sleep) was 10.6%, which is reduced. The arousals were noted as: 23 were spontaneous, 0 were associated with PLMs, 0 were associated with respiratory events.  Audio and video analysis did not show any abnormal or unusual movements, behaviors, phonations or vocalizations. The patient took 1 bathroom break. EKG was in keeping with normal sinus rhythm (NSR).  RESPIRATORY ANALYSIS:  There was a total of 0 respiratory events: 0 obstructive apneas, 0 central apneas and 0 mixed apneas with a total of 0 apneas and an apnea index (AI) of 0 /hour. There were 0 hypopneas with a hypopnea index of 0/hour. The patient also had 0 respiratory event related arousals (RERAs).      The total APNEA/HYPOPNEA INDEX  (AHI) was 0 /hour and the total RESPIRATORY DISTURBANCE INDEX was 0 .hour  0 events occurred in REM sleep and 0 events in NREM. The REM AHI was 0 /hour versus a non-REM AHI of 0 /hour.  The patient spent 344 minutes of total sleep time in the supine position and 0 minutes in non-supine. The supine AHI was 0.0, versus a non-supine AHI of 0.0.  OXYGEN SATURATION & C02:  The baseline 02 saturation was 97%, with the lowest being 85%. Time spent below 89% saturation equaled 2 minutes.  PERIODIC LIMB MOVEMENTS: The patient had a total of 0 Periodic Limb Movements. The Periodic Limb Movement (  PLM) index was 0 and the PLM Arousal index was 0 /hour.  Post-study, the patient indicated that sleep was the same as usual.   IMPRESSION:   1. Obstructive Sleep Apnea (OSA) 2. Dysfunctions associated with sleep stages or arousal from sleep   RECOMMENDATIONS:   1. This study demonstrates resolution of the patient's obstructive sleep apnea with CPAP therapy. I will, therefore, start the patient on home CPAP treatment at a pressure of 8 cm via small full face mask with  heated humidity. The patient should be reminded to be fully compliant with PAP therapy to improve sleep related symptoms and decrease long term cardiovascular risks. The patient should be reminded, that it may take up to 3 months to get fully used to using PAP with all planned sleep. The earlier full compliance is achieved, the better long term compliance tends to be. Please note that untreated obstructive sleep apnea carries additional perioperative morbidity. Patients with significant obstructive sleep apnea should receive perioperative PAP therapy and the surgeons and particularly the anesthesiologist should be informed of the diagnosis and the severity of the sleep disordered breathing. 2. This study shows sleep fragmentation and abnormal sleep stage percentages; these are nonspecific findings and per se do not signify an intrinsic sleep disorder or a cause for the patient's sleep-related symptoms. Causes include (but are not limited to) the first night effect of the sleep study, circadian rhythm disturbances, medication effect or an underlying mood disorder or medical problem.  3. The patient should be cautioned not to drive, work at heights, or operate dangerous or heavy equipment when tired or sleepy. Review and reiteration of good sleep hygiene measures should be pursued with any patient. 4. The patient will be seen in follow-up by Dr. Rexene Alberts at [facility] for discussion of the test results and further management strategies. The referring provider will be notified of the test results.   I certify that I have reviewed the entire raw data recording prior to the issuance of this report in accordance with the Standards of Accreditation of the American Academy of Sleep Medicine (AASM)     Star Age, MD, PhD Diplomat, American Board of Psychiatry and Neurology (Neurology and Sleep Medicine)

## 2018-01-14 NOTE — Telephone Encounter (Signed)
-----   Message from Heidi Age, MD sent at 01/14/2018  8:51 AM EST ----- Patient referred by Dr. Leta Baptist, seen by me on 10/28/17, diagnostic PSG on 12/08/17. Patient had a CPAP titration study on 01/09/18.  Please call and inform patient that I have entered an order for treatment with positive airway pressure (PAP) treatment for obstructive sleep apnea (OSA). She did well during the latest sleep study with CPAP. We will, therefore, arrange for a machine for home use through a DME (durable medical equipment) company of Her choice; and I will see the patient back in follow-up in about 10 weeks. Please also explain to the patient that I will be looking out for compliance data, which can be downloaded from the machine (stored on an SD card, that is inserted in the machine) or via remote access through a modem, that is built into the machine. At the time of the followup appointment we will discuss sleep study results and how it is going with PAP treatment at home. Please advise patient to bring Her machine at the time of the first FU visit, even though this is cumbersome. Bringing the machine for every visit after that will likely not be needed, but often helps for the first visit to troubleshoot if needed. Please re-enforce the importance of compliance with treatment and the need for [place] to monitor compliance data - often an insurance requirement and actually good feedback for the patient as far as how they are doing.  Also remind patient, that any interim PAP machine or mask issues should be first addressed with the DME company, as they can often help better with technical and mask fit issues. Please ask if patient has a preference regarding DME company.  Please also make sure, the patient has a follow-up appointment with me in about 10 weeks from the setup date, thanks. May see one of our nurse practitioners if needed for proper timing of the FU appointment.  Please fax or rout report to the referring provider.  Thanks,   Heidi Age, MD, PhD [facility])

## 2018-01-14 NOTE — Telephone Encounter (Signed)
I called pt. I advised pt that Dr. Rexene Alberts reviewed their sleep study results and found that pt pt did well with the cpap during her latest sleep study. Dr. Rexene Alberts recommends that pt start a cpap at home. I reviewed PAP compliance expectations with the pt. Pt is agreeable to starting a CPAP. I advised pt that an order will be sent to a DME, [facility] (pt is asking for the [place] store), and [facility] will call the pt within about one week after they file with the pt's insurance. [facility] will show the pt how to use the machine, fit for masks, and troubleshoot the CPAP if needed. A follow up appt was made for insurance purposes with Dr. Rexene Alberts on 04/17/18 at 10:30am. Pt verbalized understanding to arrive 15 minutes early and bring their CPAP. A letter with all of this information in it will be mailed to the pt as a reminder. I verified with the pt that the address we have on file is correct. Pt verbalized understanding of results. Pt had no questions at this time but was encouraged to call back if questions arise.

## 2018-01-15 NOTE — Progress Notes (Signed)
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  She has minimal bruising and no bleeding.  Blood pressure in the office today 138/80.  She claims she is compliant with her blood pressure medications 2D echo after last visit 55-60%.  Cardiac event monitor unremarkable.  MRA 10/17/2017: 1. Recent left midbrain infarct with smooth and widely patent basilar and proximal left PCA. 2. Intracranial atherosclerosis with: *High-grade bilateral mid P2 segment stenosis. *High-grade right inferior division M2 stenosis. *40% left V4 segment stenosis after the PICA branching Recent sleep study confirms obstructive sleep apnea.  She has not started her CPAP.  She had labs drawn yesterday by primary care, lipid panel was drawn according to patient.  She does not know the results but has an appointment next Monday.  She has had more blurred vision recently and has an appointment to see her ophthalmologist in 2 weeks.  She returns for reevaluation without further neurologic symptoms. 10/09/17 VP84 year old female with hypertension, here for evaluation of stroke.  November 18 patient was at a funeral home when she noticed right leg weakness.  November 20 she had worsening right leg weakness and then her balance was off.  She had some vertigo sensation.  November 21 she went to PCP who ordered a CT scan.  Slight abnormality was noted in left parietal region. On November 22nd patient had increasing slurred speech.  On November 26 patient MRI which confirmed subacute left lower midbrain, upper  pons ischemic infarction.  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  Since that time symptoms have stabilized and slightly improved.    REVIEW OF SYSTEMS: Full 14 system review of systems performed and notable only for those listed, all others are neg:  Constitutional: neg  Cardiovascular: neg Ear/Nose/Throat: neg  Skin: neg Eyes: Blurred vision Respiratory: neg Gastroitestinal: neg  Hematology/Lymphatic: neg  Endocrine: neg Musculoskeletal:neg Allergy/Immunology: neg Neurological: neg Psychiatric: neg Sleep : neg   ALLERGIES: No Known Allergies  HOME MEDICATIONS: Outpatient Medications Prior to Visit  Medication Sig Dispense Refill  . amLODipine (NORVASC) 5 MG tablet Take 5 mg by mouth daily.    Marland Kitchen aspirin 325 MG tablet Take 325 mg by mouth daily.    . cholestyramine (QUESTRAN) 4 g packet Take 2 g by mouth daily.    . hydrochlorothiazide (HYDRODIURIL) 25 MG tablet Take 25 mg by mouth daily.    Marland Kitchen lisinopril (PRINIVIL,ZESTRIL) 10 MG tablet Take 10 mg by mouth daily.    . Multiple Vitamin (MULTIVITAMIN) tablet Take 1 tablet by mouth daily.    . Multiple Vitamins-Minerals (AIRBORNE PO) Take by mouth every morning.    . Pancrelipase, Lip-Prot-Amyl, (ZENPEP PO) Take by mouth. 20,000cm daily    . Probiotic Product (PROBIOTIC DAILY PO) Take by mouth.     No facility-administered medications prior to visit.     PAST MEDICAL HISTORY: Past Medical History:  Diagnosis Date  . Anxiety    about being put to sleep for  surgery  . Arthritis   . Gallbladder disorder   . High cholesterol   . HTN (hypertension)   . Stroke [facility])  PAST SURGICAL HISTORY: Past Surgical History:  Procedure Laterality Date  . APPENDECTOMY    . ceasarean section    . CHOLECYSTECTOMY  06/16/2012   Procedure: LAPAROSCOPIC CHOLECYSTECTOMY;  Surgeon: Scherry Ran, MD;  Location: [facility];  Service: General;  Laterality: N/A;  . COLONOSCOPY  02/13/2012   Procedure: COLONOSCOPY;  Surgeon:  Rogene Houston, MD;  Location: [facility];  Service: Endoscopy;  Laterality: N/A;  930 possibly earlier for antibiotics  . Left knee replacement     2012  . TOTAL KNEE ARTHROPLASTY  04/18/2012   Procedure: TOTAL KNEE ARTHROPLASTY;  Surgeon: Yvette Rack., MD;  Location: [place];  Service: Orthopedics;  Laterality: Right;  with revison tibia    FAMILY HISTORY: Family History  Problem Relation Age of Onset  . Heart attack Father   . Other Brother        car accident  . Anesthesia problems Neg Hx     SOCIAL HISTORY: Social History   Socioeconomic History  . Marital status: Married    Spouse name: Not on file  . Number of children: Not on file  . Years of education: Not on file  . Highest education level: Not on file  Social Needs  . Financial resource strain: Not on file  . Food insecurity - worry: Not on file  . Food insecurity - inability: Not on file  . Transportation needs - medical: Not on file  . Transportation needs - non-medical: Not on file  Occupational History  . Not on file  Tobacco Use  . Smoking status: Never Smoker  . Smokeless tobacco: Never Used  Substance and Sexual Activity  . Alcohol use: No  . Drug use: No  . Sexual activity: Yes    Birth control/protection: None  Other Topics Concern  . Not on file  Social History Narrative   Lives at home with husband.       PHYSICAL EXAM  Vitals:   01/16/18 1025  BP: 138/80  Pulse: 72  Weight: 262 lb 3.2 oz (118.9 kg)   Body mass index is 42.32 kg/m.  Generalized: Well developed, obese female in no acute distress  Head: normocephalic and atraumatic,. Oropharynx benign  Neck: Supple, no carotid bruits  Cardiac: Regular rate rhythm, no murmur  Musculoskeletal: No deformity   Neurological examination   Mentation: Alert oriented to time, place, history taking. Attention span and concentration appropriate. Recent and remote memory intact.  Follows all commands speech and language fluent.   Cranial  nerve II-XII: Fundoscopic exam reveals sharp disc margins.Pupils were equal round reactive to light extraocular movements were full, visual field were full on confrontational test. Facial sensation and strength were normal. hearing was intact to finger rubbing bilaterally. Uvula tongue midline. head turning and shoulder shrug were normal and symmetric.Tongue protrusion into cheek strength was normal. Motor: normal bulk and tone, full strength in the BUE, BLE,  Sensory: normal and symmetric to light touch, pinprick, and  Vibration in the upper and lower extremities  Coordination: finger-nose-finger, heel-to-shin bilaterally, no dysmetria Reflexes: Brachioradialis 2/2, biceps 2/2, triceps 2/2, patellar 2/2, Achilles 2/2, plantar responses were flexor bilaterally. Gait and Station: Rising up from seated position without assistance, normal stance,  moderate stride, good arm swing, smooth turning, able to perform tiptoe, and heel walking without difficulty. Tandem gait is steady.  Romberg negative  DIAGNOSTIC DATA (LABS, IMAGING, TESTING) - I reviewed patient records, labs, notes, testing and imaging myself where available.  Lab Results  Component Value Date   WBC 6.9 06/17/2012   HGB 16.3 (H) 10/07/2017   HCT 48.0 (H) 10/07/2017   MCV 87.1 06/17/2012   PLT 211 06/17/2012      Component Value Date/Time   NA 143 10/07/2017 1059   K 3.9 10/07/2017 1059   CL 101 10/07/2017 1059   CO2 32 06/17/2012 0512   GLUCOSE 112 (H) 10/07/2017 1059   BUN 18 10/07/2017 1059   CREATININE 0.70 10/07/2017 1059   CALCIUM 8.9 06/17/2012 0512   PROT 7.1 01/19/2015 0936   ALBUMIN 4.0 01/19/2015 0936   AST 52 (H) 01/19/2015 0936   ALT 78 (H) 01/19/2015 0936   ALKPHOS 58 01/19/2015 0936   BILITOT 0.8 01/19/2015 0936   GFRNONAA >90 06/17/2012 0512   GFRAA >90 06/17/2012 0512    ASSESSMENT AND PLAN 67 y.o. year old female here with new left pontine ischemic infarction, possibly due to small vessel thrombosis.   However chronic left parietal ischemic infarct also noted on imaging.    30-day event monitor negative.  2D echo 55-60%.  Sleep study positive for obstructive sleep apnea.  MRA of the brain with intracranial atherosclerosis.  She returns for reevaluation       PLAN: Stressed the importance of management of risk factors to prevent further stroke Continue Aspirin 325mg   for secondary stroke prevention Maintain strict control of hypertension with blood pressure goal below 130/90, today's reading 138/80 continue antihypertensive medications Control of diabetes with hemoglobin A1c below 6.5 followed by primary care  Cholesterol with LDL cholesterol less than 70, followed by primary care,  When she gets CPAP, use every night as directed for newly diagnosed obstructive sleep apnea  exercise by walking, 30 min daily  eat healthy diet with whole grains,  fresh fruits and vegetables Follow up 6 months I spent 25 minutes in total face to face time with the patient more than 50% of which was spent counseling and coordination of care, reviewing test results reviewing medications and discussing and reviewing the diagnosis of stroke and management of risk factors and further treatment options.  Patient also reports some mild memory loss and this could be due to her untreated obstructive sleep apnea.  Answered additional questions. Dennie Bible, [facility], [facility], APRN  [facility] [address] [place], [place] [address] [phone]

## 2018-01-16 ENCOUNTER — Ambulatory Visit (INDEPENDENT_AMBULATORY_CARE_PROVIDER_SITE_OTHER): Payer: Medicare Other | Admitting: Nurse Practitioner

## 2018-01-16 ENCOUNTER — Encounter: Payer: Self-pay | Admitting: Nurse Practitioner

## 2018-01-16 VITALS — BP 138/80 | HR 72 | Wt 262.2 lb

## 2018-01-16 DIAGNOSIS — G4733 Obstructive sleep apnea (adult) (pediatric): Secondary | ICD-10-CM | POA: Diagnosis not present

## 2018-01-16 DIAGNOSIS — Z8673 Personal history of transient ischemic attack (TIA), and cerebral infarction without residual deficits: Secondary | ICD-10-CM | POA: Insufficient documentation

## 2018-01-16 DIAGNOSIS — I10 Essential (primary) hypertension: Secondary | ICD-10-CM | POA: Diagnosis not present

## 2018-01-16 NOTE — Patient Instructions (Signed)
Stressed the importance of management of risk factors to prevent further stroke Continue Aspirin for secondary stroke prevention Maintain strict control of hypertension with blood pressure goal below 130/90, today's reading 138/80 continue antihypertensive medications Control of diabetes with hemoglobin A1c below 6.5 followed by primary care  Cholesterol with LDL cholesterol less than 70, followed by primary care,  When she gets CPAP, use every night as directed Exercise by walking, 30 min daily  eat healthy diet with whole grains,  fresh fruits and vegetables Follow up 6 months

## 2018-01-16 NOTE — Progress Notes (Signed)
I reviewed note and agree with plan.   Penni Bombard, MD 9/0/3833, 3:83 PM Certified in Neurology, Neurophysiology and Neuroimaging  [facility] [address], [place], [place] [address] [phone]

## 2018-03-22 ENCOUNTER — Encounter: Payer: Self-pay | Admitting: Nurse Practitioner

## 2018-04-15 ENCOUNTER — Encounter: Payer: Self-pay | Admitting: Nurse Practitioner

## 2018-04-16 ENCOUNTER — Encounter: Payer: Self-pay | Admitting: Neurology

## 2018-04-17 ENCOUNTER — Encounter: Payer: Self-pay | Admitting: Neurology

## 2018-04-17 ENCOUNTER — Ambulatory Visit: Payer: Medicare Other | Admitting: Neurology

## 2018-04-17 VITALS — BP 135/82 | HR 68 | Ht 66.0 in | Wt 232.0 lb

## 2018-04-17 DIAGNOSIS — G4733 Obstructive sleep apnea (adult) (pediatric): Secondary | ICD-10-CM

## 2018-04-17 DIAGNOSIS — Z9989 Dependence on other enabling machines and devices: Secondary | ICD-10-CM

## 2018-04-17 NOTE — Patient Instructions (Addendum)
Please continue using your CPAP regularly. While your insurance requires that you use CPAP at least 4 hours each night on 70% of the nights, I recommend, that you not skip any nights and use it throughout the night if you can. Getting used to CPAP and staying with the treatment long term does take time and patience and discipline. Untreated obstructive sleep apnea when it is moderate to severe can have an adverse impact on cardiovascular health and raise her risk for heart disease, arrhythmias, hypertension, congestive heart failure, stroke and diabetes. Untreated obstructive sleep apnea causes sleep disruption, nonrestorative sleep, and sleep deprivation. This can have an impact on your day to day functioning and cause daytime sleepiness and impairment of cognitive function, memory loss, mood disturbance, and problems focussing. Using CPAP regularly can improve these symptoms.  Please follow up in about 3 months with Cecille Rubin, nurse practitioner. You have done a good job thus far with used CPAP, placed be consistent with a usage. Try to keep it on all night.

## 2018-04-17 NOTE — Progress Notes (Signed)
Subjective:    Patient ID: Heidi Rhodes is a 67 y.o. female.  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 The patient is accompanied by her husband today. I first met her on 10/28/2017 at the request of Dr. Leta Baptist, at which time she reported snoring and sleep disruption as well as recent stroke in November 2018. I suggested we proceed with sleep study testing. She had a baseline sleep study, followed by a CPAP titration study. I went over her test results with her in detail today. Her baseline sleep study from 12/08/2017 showed a sleep efficiency of 64.6%, sleep latency of 210 minutes which is markedly delayed. She had a delayed REM latency. She had an increased percentage of stage II sleep, absence of slow-wave sleep and REM sleep was 18.8%, overall AHI was 20.4 per hour, average oxygen saturation 98%, nadir was 84%. Based on her test results I suggested we proceed with CPAP therapy and she was advised to return for CPAP titration study. She had this on 01/09/2018. Sleep efficiency was 73.3%, sleep latency 42 minutes, REM latency was also delayed. She had an increased percentage of slow-wave sleep and REM sleep was reduced at 10.6%. She was titrated via full face mask from 5 cm to 8 cm. AHI was 0 per hour on the final pressure with brief supine REM sleep achieved an O2 nadir of 88%. Based on her test results I suggested CPAP therapy at home at a pressure of 8 cm.  Today, 04/17/2018: I reviewed her CPAP  compliance data from 03/17/2018 through 04/15/2018 which is a total of 30 days, during which time she used her CPAP every night with percent used days greater than 4 hours at 63%, indicating mildly suboptimal compliance with an average usage of 5 hours and 19 minutes, residual AHI at goal at 1.8 per hour, leak low with the 95th percentile at 4.9 L/m on a pressure of 8 cm with EPR of 2. She reports that she is still adapting to treatment. She has 2 concentrate to keep her CPAP on. She has nocturia, and does not always put it back on after bathroom break. Not necessarily super well rested. But willing to continue. No new neurological Sx. Busy with grandkids, enjoys crochet and sewing.she has been on CPAP therapy for almost 3 months, started on 01/20/2018. In the first 90 days, her percent used days greater than 4 hours is right at 72%. However, in the month or early April through May 2019 her percent used days greater than 4 hours was 90%, indicating excellent compliance with an average usage of 6 hours and 20 minutes at the time, between 02/21/2018 through 03/22/2018.   The patient's allergies, current medications, family history, past medical history, past social history, past surgical history and problem list were reviewed and updated as appropriate.   Previously (copied from previous notes for reference):   10/28/2017: (She) reports snoring and some sleep disruption. I reviewed your office note from 10/09/2017. She is a nonsmoker and does not utilize alcohol. She drinks caffeine in the  form of coffee, usually one cup per day. She is married and lives with her husband. They have 3 children. She is retired. She feels almost back to baseline. Her ESS is 4/24, and fatigue score of 11/63.  She has nocturia, about 1-2 times, rare AM HAs. No Hx of migraines. Has been found to have thyroid nodule. Bedtime is around 10 PM and WT is around 6 AM. She has some residual R sided weakness and maybe some speech issues.  She has otherwise come along well. She denies RLS symptoms. She is not aware of any family history of OSA. She is a retired Programmer, multimedia. She would be willing to undergo a sleep study but would like to wait until her new insurance kicks in after the first of the year.   Her Past Medical History Is Significant For: Past Medical History:  Diagnosis Date  . Anxiety    about being put to sleep for  surgery  . Arthritis   . Gallbladder disorder   . High cholesterol   . HTN (hypertension)   . Stroke [facility])     Her Past Surgical History Is Significant For: Past Surgical History:  Procedure Laterality Date  . APPENDECTOMY    . ceasarean section    . CHOLECYSTECTOMY  06/16/2012   Procedure: LAPAROSCOPIC CHOLECYSTECTOMY;  Surgeon: Scherry Ran, MD;  Location: [facility];  Service: General;  Laterality: N/A;  . COLONOSCOPY  02/13/2012   Procedure: COLONOSCOPY;  Surgeon: Rogene Houston, MD;  Location: [facility];  Service: Endoscopy;  Laterality: N/A;  930 possibly earlier for antibiotics  . Left knee replacement     2012  . TOTAL KNEE ARTHROPLASTY  04/18/2012   Procedure: TOTAL KNEE ARTHROPLASTY;  Surgeon: Yvette Rack., MD;  Location: [facility];  Service: Orthopedics;  Laterality: Right;  with revison tibia    Her Family History Is Significant For: Family History  Problem Relation Age of Onset  . Heart attack Father   . Other Brother        car accident  . Anesthesia problems Neg Hx     Her Social History Is Significant For: Social History   Socioeconomic History  . Marital status: Married    Spouse name: Not on file  . Number of children: Not on file  . Years of education: Not on file  . Highest education level: Not on file  Occupational History  . Not on file  Social Needs  . Financial resource strain: Not on file  . Food insecurity:    Worry: Not on file    Inability: Not on file  . Transportation needs:    Medical: Not on file    Non-medical: Not on file   Tobacco Use  . Smoking status: Never Smoker  . Smokeless tobacco: Never Used  Substance and Sexual Activity  . Alcohol use: No  . Drug use: No  . Sexual activity: Yes    Birth control/protection: None  Lifestyle  . Physical activity:    Days per week: Not on file    Minutes per session: Not on file  . Stress: Not on file  Relationships  . Social connections:    Talks on phone: Not on file    Gets together: Not on file    Attends religious service: Not on file    Active member of club or organization: Not on file    Attends meetings of clubs or organizations: Not on file  Relationship status: Not on file  Other Topics Concern  . Not on file  Social History Narrative   Lives at home with husband.      Her Allergies Are:  No Known Allergies:   Her Current Medications Are:  Outpatient Encounter Medications as of 04/17/2018  Medication Sig  . amLODipine (NORVASC) 5 MG tablet Take 5 mg by mouth daily.  Marland Kitchen aspirin 325 MG tablet Take 325 mg by mouth daily.  . cholestyramine (QUESTRAN) 4 g packet Take 2 g by mouth daily.  . hydrochlorothiazide (HYDRODIURIL) 25 MG tablet Take 25 mg by mouth daily.  Marland Kitchen lisinopril (PRINIVIL,ZESTRIL) 10 MG tablet Take 10 mg by mouth daily.  . mirabegron ER (MYRBETRIQ) 25 MG TB24 tablet Take 25 mg by mouth daily.  . Multiple Vitamin (MULTIVITAMIN) tablet Take 1 tablet by mouth daily.  . Multiple Vitamins-Minerals (AIRBORNE PO) Take by mouth every morning.  . Pancrelipase, Lip-Prot-Amyl, (ZENPEP PO) Take by mouth. 20,000cm daily  . Probiotic Product (PROBIOTIC DAILY PO) Take by mouth.  . rosuvastatin (CRESTOR) 5 MG tablet Take 5 mg by mouth daily.   No facility-administered encounter medications on file as of 04/17/2018.   :  Review of Systems:  Out of a complete 14 point review of systems, all are reviewed and negative with the exception of these symptoms as listed below: Review of Systems  Neurological:       Pt presents today to discuss her  cpap. Pt has to "concentrate" at night to use it.    Objective:  Neurological Exam  Physical Exam Physical Examination:   Vitals:   04/17/18 1037  BP: 135/82  Pulse: 68    General Examination: The patient is a very pleasant 66 y.o. female in no acute distress. She appears well-developed and well-nourished and well groomed. Good spirits.  HEENT: Normocephalic, atraumatic, pupils are equal, round and reactive to light and accommodation. bilateral cataracts noted.She has corrective eyeglasses. Extraocular tracking is good without limitation to gaze excursion or nystagmus noted. Normal smooth pursuit is noted. Hearing is grossly intact. Face is symmetric with normal facial animation and normal facial sensation. Speech is clear with no dysarthria noted, minimal word finding difficulty noted. Airway examination reveals no significant mouth dryness, adequate dental hygiene, mild airway crowding. Tongue and palate are symmetrical. Neck circumference is 16-5/8 inches.  Chest: Clear to auscultation without wheezing, rhonchi or crackles noted.  Heart: S1+S2+0, regular and normal without murmurs, rubs or gallops noted.   Abdomen: Soft, non-tender and non-distended with normal bowel sounds appreciated on auscultation.  Extremities: There is trace edema.  Skin: Warm and dry without trophic changes noted.  Musculoskeletal: exam reveals no obvious joint deformities, tenderness or joint swelling or erythema, s/p b/l TKA.   Neurologically:  Mental status: The patient is awake, alert and oriented in all 4 spheres. Her immediate and remote memory, attention, language skills and fund of knowledge are appropriate. There is no evidence of aphasia, agnosia, apraxia or anomia. Speech is clear with normal prosody and enunciation. Thought process is linear. Mood is normal and affect is normal.  Cranial nerves II - XII are as described above under HEENT exam. In addition: shoulder shrug is normal with  equal shoulder height noted. Motor exam: Normal bulk, strength and tone is noted. May have subtle right grip strength weakness and right hip flexion weakness. Stable. Reflexes are 1+ in UEs. Fine motor skills and coordination: intact with normal finger taps, normal hand movements, normal rapid alternating patting, normal foot taps  and normal foot agility.  Cerebellar testing: No dysmetria or intention tremor on finger to nose testing.  Sensory exam: intact to light touch in the upper and lower extremities.  Gait, station and balance: She stands with minimal difficulty. No veering to one side is noted. No leaning to one side is noted. Posture is age-appropriate and stance is somewhat wider based. No limp. all stable.   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 She was diagnosed with moderate obstructive sleep apnea with a baseline sleep study in January 2019, followed by a CPAP titration study in February 2019. She had some telltale improvement subjectively during her sleep study with treatment with CPAP. She has established treatment with CPAP at home. She was excellent with her compliance early on during the second month of her treatment between 02/21/2018 and 03/22/2018 during which time she had a more than 4 hours  Usage percentage of 90%. Average usage was 6 hours and 20 minutes at the time. Her compliance has declined a little bit. She finds it harder to put the CPAP back on when she goes to the bathroom and is encouraged to keep using CPAP consistently.  She is commended for her treatment adherence. She indicates intention to continue with treatment at this time.  She is advised to keep her appointment routinely in 3 months with Cecille Rubin. From a sleep apnea standpoint, she can be seen 6-12 monthly after that. Neurologically, she has remained stable thankfully. She tries to stay active mentally and physically. She tries to walk. I answered all their questions today and the patient and her husband were in agreement. I spent 25 minutes in total face-to-face time with the patient, more than 50% of which was spent in counseling and coordination of care, reviewing test results, reviewing medication and discussing or reviewing the diagnosis of OSA, its prognosis and treatment options. Pertinent laboratory and imaging test results that were available during this visit with the patient were reviewed by me and considered in my medical decision making (see chart for details).

## 2018-04-30 ENCOUNTER — Other Ambulatory Visit: Payer: Self-pay | Admitting: Internal Medicine

## 2018-04-30 DIAGNOSIS — Z780 Asymptomatic menopausal state: Secondary | ICD-10-CM

## 2018-05-08 ENCOUNTER — Other Ambulatory Visit (HOSPITAL_COMMUNITY): Payer: Self-pay | Admitting: Internal Medicine

## 2018-05-08 DIAGNOSIS — Z1231 Encounter for screening mammogram for malignant neoplasm of breast: Secondary | ICD-10-CM

## 2018-05-26 ENCOUNTER — Ambulatory Visit (HOSPITAL_COMMUNITY): Payer: Medicare Other

## 2018-05-28 NOTE — Patient Instructions (Signed)
Your procedure is scheduled on: 06/02/2018  Report to [facility] at  72   AM.  Call this number if you have problems the morning of surgery: [phone]   Do not eat food or drink liquids :After Midnight.      Take these medicines the morning of surgery with A SIP OF WATER: amlodipine, lisinopril, mirabegron ER.   Do not wear jewelry, make-up or nail polish.  Do not wear lotions, powders, or perfumes. You may wear deodorant.  Do not shave 48 hours prior to surgery.  Do not bring valuables to the hospital.  Contacts, dentures or bridgework may not be worn into surgery.  Leave suitcase in the car. After surgery it may be brought to your room.  For patients admitted to the hospital, checkout time is 11:00 AM the day of discharge.   Patients discharged the day of surgery will not be allowed to drive home.  :     Please read over the following fact sheets that you were given: Coughing and Deep Breathing, Surgical Site Infection Prevention, Anesthesia Post-op Instructions and Care and Recovery After Surgery    Cataract A cataract is a clouding of the lens of the eye. When a lens becomes cloudy, vision is reduced based on the degree and nature of the clouding. Many cataracts reduce vision to some degree. Some cataracts make people more near-sighted as they develop. Other cataracts increase glare. Cataracts that are ignored and become worse can sometimes look white. The white color can be seen through the pupil. CAUSES   Aging. However, cataracts may occur at any age, even in newborns.   Certain drugs.   Trauma to the eye.   Certain diseases such as diabetes.   Specific eye diseases such as chronic inflammation inside the eye or a sudden attack of a rare form of glaucoma.   Inherited or acquired medical problems.  SYMPTOMS   Gradual, progressive drop in vision in the affected eye.   Severe, rapid visual loss. This most often happens when trauma is the cause.  DIAGNOSIS  To detect a  cataract, an eye doctor examines the lens. Cataracts are best diagnosed with an exam of the eyes with the pupils enlarged (dilated) by drops.  TREATMENT  For an early cataract, vision may improve by using different eyeglasses or stronger lighting. If that does not help your vision, surgery is the only effective treatment. A cataract needs to be surgically removed when vision loss interferes with your everyday activities, such as driving, reading, or watching TV. A cataract may also have to be removed if it prevents examination or treatment of another eye problem. Surgery removes the cloudy lens and usually replaces it with a substitute lens (intraocular lens, IOL).  At a time when both you and your doctor agree, the cataract will be surgically removed. If you have cataracts in both eyes, only one is usually removed at a time. This allows the operated eye to heal and be out of danger from any possible problems after surgery (such as infection or poor wound healing). In rare cases, a cataract may be doing damage to your eye. In these cases, your caregiver may advise surgical removal right away. The vast majority of people who have cataract surgery have better vision afterward. HOME CARE INSTRUCTIONS  If you are not planning surgery, you may be asked to do the following:  Use different eyeglasses.   Use stronger or brighter lighting.   Ask your eye doctor about reducing  your medicine dose or changing medicines if it is thought that a medicine caused your cataract. Changing medicines does not make the cataract go away on its own.   Become familiar with your surroundings. Poor vision can lead to injury. Avoid bumping into things on the affected side. You are at a higher risk for tripping or falling.   Exercise extreme care when driving or operating machinery.   Wear sunglasses if you are sensitive to bright light or experiencing problems with glare.  SEEK IMMEDIATE MEDICAL CARE IF:   You have a  worsening or sudden vision loss.   You notice redness, swelling, or increasing pain in the eye.   You have a fever.  Document Released: 10/29/2005 Document Revised: 10/18/2011 Document Reviewed: 06/22/2011 [facility] Patient Information 2012 [place].PATIENT INSTRUCTIONS POST-ANESTHESIA  IMMEDIATELY FOLLOWING SURGERY:  Do not drive or operate machinery for the first twenty four hours after surgery.  Do not make any important decisions for twenty four hours after surgery or while taking narcotic pain medications or sedatives.  If you develop intractable nausea and vomiting or a severe headache please notify your doctor immediately.  FOLLOW-UP:  Please make an appointment with your surgeon as instructed. You do not need to follow up with anesthesia unless specifically instructed to do so.  WOUND CARE INSTRUCTIONS (if applicable):  Keep a dry clean dressing on the anesthesia/puncture wound site if there is drainage.  Once the wound has quit draining you may leave it open to air.  Generally you should leave the bandage intact for twenty four hours unless there is drainage.  If the epidural site drains for more than 36-48 hours please call the anesthesia department.  QUESTIONS?:  Please feel free to call your physician or the hospital operator if you have any questions, and they will be happy to assist you.

## 2018-05-29 ENCOUNTER — Other Ambulatory Visit: Payer: Self-pay

## 2018-05-29 ENCOUNTER — Encounter (HOSPITAL_COMMUNITY)
Admission: RE | Admit: 2018-05-29 | Discharge: 2018-05-29 | Disposition: A | Discharge status: Home / Self Care | Payer: Medicare Other | Source: Ambulatory Visit | Attending: Ophthalmology | Admitting: Ophthalmology

## 2018-05-29 ENCOUNTER — Encounter (HOSPITAL_COMMUNITY): Payer: Self-pay

## 2018-05-29 DIAGNOSIS — Z01810 Encounter for preprocedural cardiovascular examination: Secondary | ICD-10-CM | POA: Insufficient documentation

## 2018-05-29 DIAGNOSIS — Z01812 Encounter for preprocedural laboratory examination: Secondary | ICD-10-CM | POA: Diagnosis present

## 2018-05-29 HISTORY — DX: Sleep apnea, unspecified: G47.30

## 2018-05-29 LAB — CBC WITH DIFFERENTIAL/PLATELET
BASOS ABS: 0 10*3/uL (ref 0.0–0.1)
BASOS PCT: 1 %
Eosinophils Absolute: 0.2 10*3/uL (ref 0.0–0.7)
Eosinophils Relative: 3 %
HEMATOCRIT: 43.5 % (ref 36.0–46.0)
HEMOGLOBIN: 14.3 g/dL (ref 12.0–15.0)
Lymphocytes Relative: 23 %
Lymphs Abs: 1.4 10*3/uL (ref 0.7–4.0)
MCH: 31.1 pg (ref 26.0–34.0)
MCHC: 32.9 g/dL (ref 30.0–36.0)
MCV: 94.6 fL (ref 78.0–100.0)
MONOS PCT: 10 %
Monocytes Absolute: 0.6 10*3/uL (ref 0.1–1.0)
NEUTROS ABS: 4.1 10*3/uL (ref 1.7–7.7)
NEUTROS PCT: 63 %
Platelets: 184 10*3/uL (ref 150–400)
RBC: 4.6 MIL/uL (ref 3.87–5.11)
RDW: 13 % (ref 11.5–15.5)
WBC: 6.3 10*3/uL (ref 4.0–10.5)

## 2018-05-29 LAB — BASIC METABOLIC PANEL
Anion gap: 8 (ref 5–15)
BUN: 19 mg/dL (ref 8–23)
CHLORIDE: 107 mmol/L (ref 98–111)
CO2: 28 mmol/L (ref 22–32)
Calcium: 9.1 mg/dL (ref 8.9–10.3)
Creatinine, Ser: 0.63 mg/dL (ref 0.44–1.00)
GFR calc non Af Amer: >60 mL/min (ref >60)
Glucose, Bld: 101 mg/dL — ABNORMAL HIGH (ref 70–99)
POTASSIUM: 4 mmol/L (ref 3.5–5.1)
Sodium: 143 mmol/L (ref 135–145)

## 2018-06-02 ENCOUNTER — Encounter (HOSPITAL_COMMUNITY): Payer: Self-pay | Admitting: Anesthesiology

## 2018-06-02 ENCOUNTER — Ambulatory Visit (HOSPITAL_COMMUNITY): Payer: Medicare Other | Admitting: Anesthesiology

## 2018-06-02 ENCOUNTER — Ambulatory Visit (HOSPITAL_COMMUNITY)
Admission: RE | Admit: 2018-06-02 | Discharge: 2018-06-02 | Disposition: A | Discharge status: Home / Self Care | Payer: Medicare Other | Source: Ambulatory Visit | Attending: Ophthalmology | Admitting: Ophthalmology

## 2018-06-02 ENCOUNTER — Encounter (HOSPITAL_COMMUNITY)
Admission: RE | Disposition: A | Discharge status: Home / Self Care | Payer: Self-pay | Source: Ambulatory Visit | Attending: Ophthalmology

## 2018-06-02 DIAGNOSIS — I10 Essential (primary) hypertension: Secondary | ICD-10-CM | POA: Diagnosis not present

## 2018-06-02 DIAGNOSIS — H25812 Combined forms of age-related cataract, left eye: Secondary | ICD-10-CM | POA: Diagnosis not present

## 2018-06-02 DIAGNOSIS — G4730 Sleep apnea, unspecified: Secondary | ICD-10-CM | POA: Insufficient documentation

## 2018-06-02 HISTORY — PX: CATARACT EXTRACTION W/PHACO: SHX586

## 2018-06-02 SURGERY — PHACOEMULSIFICATION, CATARACT, WITH IOL INSERTION
Anesthesia: Monitor Anesthesia Care | Site: Eye | Laterality: Left

## 2018-06-02 MED ORDER — LIDOCAINE HCL 3.5 % OP GEL
1.0000 "application " | Freq: Once | OPHTHALMIC | Status: AC
  Administered 2018-06-02: 1 via OPHTHALMIC

## 2018-06-02 MED ORDER — LIDOCAINE HCL (PF) 1 % IJ SOLN
INTRAMUSCULAR | Status: DC | PRN
  Administered 2018-06-02: .4 mL

## 2018-06-02 MED ORDER — NEOMYCIN-POLYMYXIN-DEXAMETH 3.5-10000-0.1 OP SUSP
OPHTHALMIC | Status: DC | PRN
  Administered 2018-06-02: 2 [drp] via OPHTHALMIC

## 2018-06-02 MED ORDER — MIDAZOLAM HCL 2 MG/2ML IJ SOLN
INTRAMUSCULAR | Status: AC
  Filled 2018-06-02: qty 2

## 2018-06-02 MED ORDER — PROVISC 10 MG/ML IO SOLN
INTRAOCULAR | Status: DC | PRN
  Administered 2018-06-02: 0.85 mL via INTRAOCULAR

## 2018-06-02 MED ORDER — POVIDONE-IODINE 5 % OP SOLN
OPHTHALMIC | Status: DC | PRN
  Administered 2018-06-02: 1 via OPHTHALMIC

## 2018-06-02 MED ORDER — TETRACAINE HCL 0.5 % OP SOLN
1.0000 [drp] | OPHTHALMIC | Status: AC
  Administered 2018-06-02 (×3): 1 [drp] via OPHTHALMIC

## 2018-06-02 MED ORDER — CYCLOPENTOLATE-PHENYLEPHRINE 0.2-1 % OP SOLN
1.0000 [drp] | OPHTHALMIC | Status: AC
  Administered 2018-06-02 (×3): 1 [drp] via OPHTHALMIC

## 2018-06-02 MED ORDER — MIDAZOLAM HCL 5 MG/5ML IJ SOLN
INTRAMUSCULAR | Status: DC | PRN
  Administered 2018-06-02 (×2): 1 mg via INTRAVENOUS

## 2018-06-02 MED ORDER — EPINEPHRINE PF 1 MG/ML IJ SOLN
INTRAOCULAR | Status: DC | PRN
  Administered 2018-06-02: 500 mL

## 2018-06-02 MED ORDER — PHENYLEPHRINE HCL 2.5 % OP SOLN
1.0000 [drp] | OPHTHALMIC | Status: AC
  Administered 2018-06-02 (×3): 1 [drp] via OPHTHALMIC

## 2018-06-02 MED ORDER — BSS IO SOLN
INTRAOCULAR | Status: DC | PRN
  Administered 2018-06-02: 15 mL

## 2018-06-02 MED ORDER — LACTATED RINGERS IV SOLN
INTRAVENOUS | Status: DC
  Administered 2018-06-02: 08:00:00 via INTRAVENOUS

## 2018-06-02 SURGICAL SUPPLY — 12 items
CLOTH BEACON ORANGE TIMEOUT ST (SAFETY) ×2 IMPLANT
EYE SHIELD UNIVERSAL CLEAR (GAUZE/BANDAGES/DRESSINGS) ×2 IMPLANT
GLOVE BIOGEL PI IND STRL 6.5 (GLOVE) IMPLANT
GLOVE BIOGEL PI IND STRL 7.0 (GLOVE) IMPLANT
GLOVE BIOGEL PI INDICATOR 6.5 (GLOVE) ×2
GLOVE BIOGEL PI INDICATOR 7.0 (GLOVE) ×2
LENS ALC ACRYL/TECN (Ophthalmic Related) ×2 IMPLANT
PAD ARMBOARD 7.5X6 YLW CONV (MISCELLANEOUS) ×2 IMPLANT
SYRINGE LUER LOK 1CC (MISCELLANEOUS) ×2 IMPLANT
TAPE SURG TRANSPORE 1 IN (GAUZE/BANDAGES/DRESSINGS) IMPLANT
TAPE SURGICAL TRANSPORE 1 IN (GAUZE/BANDAGES/DRESSINGS) ×2
WATER STERILE IRR 250ML POUR (IV SOLUTION) ×2 IMPLANT

## 2018-06-02 NOTE — Anesthesia Postprocedure Evaluation (Signed)
Anesthesia Post Note  Patient: Heidi Rhodes  Procedure(s) Performed: CATARACT EXTRACTION PHACO AND INTRAOCULAR LENS PLACEMENT LEFT EYE (Left Eye)  Patient location during evaluation: Short Stay Anesthesia Type: MAC Level of consciousness: awake and patient cooperative Pain management: pain level controlled Vital Signs Assessment: post-procedure vital signs reviewed and stable Respiratory status: spontaneous breathing, nonlabored ventilation and respiratory function stable Cardiovascular status: blood pressure returned to baseline Postop Assessment: no apparent nausea or vomiting Anesthetic complications: no     Last Vitals:  Vitals:   06/02/18 0759 06/02/18 0803  BP:  117/65  Pulse: 67   Resp: 20   Temp: 36.7 C     Last Pain:  Vitals:   06/02/18 0759  TempSrc: Oral  PainSc: 0-No pain                 Arvon Schreiner J

## 2018-06-02 NOTE — Op Note (Signed)
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  Assistants: None  Anesthesia: Topical with MAC  Indications: Painless, progressive loss of vision with compromise of daily activities.  Surgery: Cataract Extraction with Intraocular lens Implant Left Eye  Discription: The patient had dilating drops and viscous lidocaine placed into the Left eye in the pre-op holding area. After transfer to the operating room, a time out was performed. The patient was then prepped and draped. Beginning with a 89m blade a paracentesis port was made at the surgeon's 2 o'clock position. The anterior chamber was then filled with 1% non-preserved lidocaine. This was followed by filling the anterior chamber with Provisc.  A 2.448mkeratome blade was used to make a clear corneal incision at the temporal limbus.  A bent cystatome needle was used to create a continuous tear capsulotomy. Hydrodissection was performed with balanced salt solution on a Fine canula. The lens nucleus was then removed using the phacoemulsification handpiece. Residual cortex was removed with the I&A handpiece. The anterior chamber and capsular bag were refilled with Provisc. A posterior chamber intraocular lens was placed into the capsular bag with it's injector. The implant was positioned with the Kuglan hook. The Provisc was then removed from the anterior chamber and capsular bag with the I&A handpiece. Stromal hydration of the main incision and paracentesis port was performed with BSS on a Fine canula. The wounds were tested for leak which was negative. The patient tolerated the procedure well. There were no operative complications. The patient was then transferred to the recovery room in stable condition.  Complications: None  Specimen: None  EBL: None  Prosthetic device: J[employer]J Technis, PCB00, power 21.0, SN  [id number]

## 2018-06-02 NOTE — H&P (Signed)
I have reviewed the H&P, the patient was re-examined, and I have identified no interval changes in medical condition and plan of care since the history and physical of record  

## 2018-06-02 NOTE — Anesthesia Preprocedure Evaluation (Signed)
Anesthesia Evaluation  Patient identified by MRN, date of birth, ID band Patient awake    Reviewed: Allergy & Precautions, H&P , NPO status , Patient's Chart, lab work & pertinent test results  Airway Mallampati: II  TM Distance: >3 FB Neck ROM: full    Dental no notable dental hx.    Pulmonary neg pulmonary ROS, sleep apnea ,    Pulmonary exam normal breath sounds clear to auscultation       Cardiovascular Exercise Tolerance: Good hypertension, negative cardio ROS   Rhythm:regular Rate:Normal     Neuro/Psych CVA negative neurological ROS  negative psych ROS   GI/Hepatic negative GI ROS, Neg liver ROS,   Endo/Other  negative endocrine ROS  Renal/GU negative Renal ROS  negative genitourinary   Musculoskeletal   Abdominal   Peds  Hematology negative hematology ROS (+)   Anesthesia Other Findings   Reproductive/Obstetrics negative OB ROS                             Anesthesia Physical Anesthesia Plan  ASA: III  Anesthesia Plan: MAC   Post-op Pain Management:    Induction:   PONV Risk Score and Plan:   Airway Management Planned:   Additional Equipment:   Intra-op Plan:   Post-operative Plan:   Informed Consent: I have reviewed the patients History and Physical, chart, labs and discussed the procedure including the risks, benefits and alternatives for the proposed anesthesia with the patient or authorized representative who has indicated his/her understanding and acceptance.     Plan Discussed with: CRNA  Anesthesia Plan Comments:         Anesthesia Quick Evaluation

## 2018-06-02 NOTE — Transfer of Care (Signed)
Immediate Anesthesia Transfer of Care Note  Patient: Heidi Rhodes  Procedure(s) Performed: CATARACT EXTRACTION PHACO AND INTRAOCULAR LENS PLACEMENT LEFT EYE (Left Eye)  Patient Location: Short Stay  Anesthesia Type:MAC  Level of Consciousness: awake and patient cooperative  Airway & Oxygen Therapy: Patient Spontanous Breathing  Post-op Assessment: Report given to RN, Post -op Vital signs reviewed and stable and Patient moving all extremities  Post vital signs: Reviewed and stable  Last Vitals:  Vitals Value Taken Time  BP    Temp    Pulse    Resp    SpO2      Last Pain:  Vitals:   06/02/18 0759  TempSrc: Oral  PainSc: 0-No pain         Complications: No apparent anesthesia complications

## 2018-06-03 ENCOUNTER — Encounter (HOSPITAL_COMMUNITY): Payer: Self-pay | Admitting: Ophthalmology

## 2018-06-09 ENCOUNTER — Other Ambulatory Visit (HOSPITAL_COMMUNITY): Payer: Medicare Other

## 2018-06-09 ENCOUNTER — Ambulatory Visit (HOSPITAL_COMMUNITY): Payer: Medicare Other

## 2018-06-12 ENCOUNTER — Ambulatory Visit (HOSPITAL_COMMUNITY)
Admission: RE | Admit: 2018-06-12 | Discharge: 2018-06-12 | Disposition: A | Discharge status: Home / Self Care | Payer: Medicare Other | Source: Ambulatory Visit | Attending: Internal Medicine | Admitting: Internal Medicine

## 2018-06-12 ENCOUNTER — Encounter (HOSPITAL_COMMUNITY): Payer: Self-pay

## 2018-06-12 DIAGNOSIS — Z1231 Encounter for screening mammogram for malignant neoplasm of breast: Secondary | ICD-10-CM | POA: Insufficient documentation

## 2018-06-12 DIAGNOSIS — Z780 Asymptomatic menopausal state: Secondary | ICD-10-CM | POA: Diagnosis not present

## 2018-06-17 ENCOUNTER — Encounter (HOSPITAL_COMMUNITY): Payer: Self-pay

## 2018-06-18 ENCOUNTER — Encounter (HOSPITAL_COMMUNITY)
Admission: RE | Admit: 2018-06-18 | Discharge: 2018-06-18 | Disposition: A | Discharge status: Home / Self Care | Payer: Medicare Other | Source: Ambulatory Visit | Attending: Ophthalmology | Admitting: Ophthalmology

## 2018-06-23 ENCOUNTER — Encounter (HOSPITAL_COMMUNITY): Payer: Self-pay | Admitting: *Deleted

## 2018-06-23 ENCOUNTER — Ambulatory Visit (HOSPITAL_COMMUNITY)
Admission: RE | Admit: 2018-06-23 | Discharge: 2018-06-23 | Disposition: A | Discharge status: Home / Self Care | Payer: Medicare Other | Source: Ambulatory Visit | Attending: Ophthalmology | Admitting: Ophthalmology

## 2018-06-23 ENCOUNTER — Ambulatory Visit (HOSPITAL_COMMUNITY): Payer: Medicare Other | Admitting: Anesthesiology

## 2018-06-23 ENCOUNTER — Other Ambulatory Visit: Payer: Self-pay

## 2018-06-23 ENCOUNTER — Encounter (HOSPITAL_COMMUNITY)
Admission: RE | Disposition: A | Discharge status: Home / Self Care | Payer: Self-pay | Source: Ambulatory Visit | Attending: Ophthalmology

## 2018-06-23 DIAGNOSIS — I10 Essential (primary) hypertension: Secondary | ICD-10-CM | POA: Insufficient documentation

## 2018-06-23 DIAGNOSIS — K219 Gastro-esophageal reflux disease without esophagitis: Secondary | ICD-10-CM | POA: Diagnosis not present

## 2018-06-23 DIAGNOSIS — H25811 Combined forms of age-related cataract, right eye: Secondary | ICD-10-CM | POA: Diagnosis not present

## 2018-06-23 DIAGNOSIS — Z79899 Other long term (current) drug therapy: Secondary | ICD-10-CM | POA: Insufficient documentation

## 2018-06-23 DIAGNOSIS — E7800 Pure hypercholesterolemia, unspecified: Secondary | ICD-10-CM | POA: Insufficient documentation

## 2018-06-23 DIAGNOSIS — F419 Anxiety disorder, unspecified: Secondary | ICD-10-CM | POA: Insufficient documentation

## 2018-06-23 DIAGNOSIS — Z7982 Long term (current) use of aspirin: Secondary | ICD-10-CM | POA: Diagnosis not present

## 2018-06-23 DIAGNOSIS — Z8673 Personal history of transient ischemic attack (TIA), and cerebral infarction without residual deficits: Secondary | ICD-10-CM | POA: Diagnosis not present

## 2018-06-23 DIAGNOSIS — G4730 Sleep apnea, unspecified: Secondary | ICD-10-CM | POA: Diagnosis not present

## 2018-06-23 HISTORY — PX: CATARACT EXTRACTION W/PHACO: SHX586

## 2018-06-23 SURGERY — PHACOEMULSIFICATION, CATARACT, WITH IOL INSERTION
Anesthesia: Monitor Anesthesia Care | Site: Eye | Laterality: Right

## 2018-06-23 MED ORDER — CYCLOPENTOLATE-PHENYLEPHRINE 0.2-1 % OP SOLN
1.0000 [drp] | OPHTHALMIC | Status: AC
  Administered 2018-06-23 (×3): 1 [drp] via OPHTHALMIC

## 2018-06-23 MED ORDER — BSS IO SOLN
INTRAOCULAR | Status: DC | PRN
  Administered 2018-06-23: 15 mL

## 2018-06-23 MED ORDER — TETRACAINE HCL 0.5 % OP SOLN
1.0000 [drp] | OPHTHALMIC | Status: AC
  Administered 2018-06-23 (×3): 1 [drp] via OPHTHALMIC

## 2018-06-23 MED ORDER — MIDAZOLAM HCL 2 MG/2ML IJ SOLN
INTRAMUSCULAR | Status: AC
  Filled 2018-06-23: qty 2

## 2018-06-23 MED ORDER — MIDAZOLAM HCL 5 MG/5ML IJ SOLN
INTRAMUSCULAR | Status: DC | PRN
  Administered 2018-06-23: 2 mg via INTRAVENOUS

## 2018-06-23 MED ORDER — LACTATED RINGERS IV SOLN
INTRAVENOUS | Status: DC
  Administered 2018-06-23: 11:00:00 via INTRAVENOUS

## 2018-06-23 MED ORDER — NEOMYCIN-POLYMYXIN-DEXAMETH 3.5-10000-0.1 OP SUSP
OPHTHALMIC | Status: DC | PRN
  Administered 2018-06-23: 2 [drp] via OPHTHALMIC

## 2018-06-23 MED ORDER — LIDOCAINE HCL (PF) 1 % IJ SOLN
INTRAMUSCULAR | Status: DC | PRN
  Administered 2018-06-23: .5 mL

## 2018-06-23 MED ORDER — POVIDONE-IODINE 5 % OP SOLN
OPHTHALMIC | Status: DC | PRN
  Administered 2018-06-23: 1 via OPHTHALMIC

## 2018-06-23 MED ORDER — LIDOCAINE HCL 3.5 % OP GEL
1.0000 "application " | Freq: Once | OPHTHALMIC | Status: AC
  Administered 2018-06-23: 1 via OPHTHALMIC

## 2018-06-23 MED ORDER — PROVISC 10 MG/ML IO SOLN
INTRAOCULAR | Status: DC | PRN
  Administered 2018-06-23: 0.85 mL via INTRAOCULAR

## 2018-06-23 MED ORDER — EPINEPHRINE PF 1 MG/ML IJ SOLN
INTRAOCULAR | Status: DC | PRN
  Administered 2018-06-23: 500 mL

## 2018-06-23 MED ORDER — PHENYLEPHRINE HCL 2.5 % OP SOLN
1.0000 [drp] | OPHTHALMIC | Status: AC | PRN
  Administered 2018-06-23 (×3): 1 [drp] via OPHTHALMIC

## 2018-06-23 SURGICAL SUPPLY — 10 items

## 2018-06-23 NOTE — Op Note (Signed)
Date of Admission: 06/23/2018  Date of Surgery: 06/23/2018  Pre-Op Dx: Cataract Right  Eye  Post-Op Dx: Senile Combined Cataract  Right  Eye,  Dx Code B52.481  Surgeon: Tonny Branch, M.D.  Assistants: None  Anesthesia: Topical with MAC  Indications: Painless, progressive loss of vision with compromise of daily activities.  Surgery: Cataract Extraction with Intraocular lens Implant Right Eye  Discription: The patient had dilating drops and viscous lidocaine placed into the Right eye in the pre-op holding area. After transfer to the operating room, a time out was performed. The patient was then prepped and draped. Beginning with a 67m blade a paracentesis port was made at the surgeon's 2 o'clock position. The anterior chamber was then filled with 1% non-preserved lidocaine. This was followed by filling the anterior chamber with Provisc.  A 2.446mkeratome blade was used to make a clear corneal incision at the temporal limbus.  A bent cystatome needle was used to create a continuous tear capsulotomy. Hydrodissection was performed with balanced salt solution on a Fine canula. The lens nucleus was then removed using the phacoemulsification handpiece. Residual cortex was removed with the I&A handpiece. The anterior chamber and capsular bag were refilled with Provisc. A posterior chamber intraocular lens was placed into the capsular bag with it's injector. The implant was positioned with the Kuglan hook. The Provisc was then removed from the anterior chamber and capsular bag with the I&A handpiece. Stromal hydration of the main incision and paracentesis port was performed with BSS on a Fine canula. The wounds were tested for leak which was negative. The patient tolerated the procedure well. There were no operative complications. The patient was then transferred to the recovery room in stable condition.  Complications: None  Specimen: None  EBL: None  Prosthetic device: J&J Technis, PCB00, power 21.0,  SN [id number]

## 2018-06-23 NOTE — Anesthesia Preprocedure Evaluation (Signed)
Anesthesia Evaluation  Patient identified by MRN, date of birth, ID band Patient awake    Reviewed: Allergy & Precautions, H&P , NPO status , Patient's Chart, lab work & pertinent test results, reviewed documented beta blocker date and time   Airway Mallampati: II  TM Distance: >3 FB Neck ROM: full    Dental no notable dental hx.    Pulmonary neg pulmonary ROS, sleep apnea ,    Pulmonary exam normal breath sounds clear to auscultation       Cardiovascular Exercise Tolerance: Good hypertension, negative cardio ROS   Rhythm:regular Rate:Normal     Neuro/Psych PSYCHIATRIC DISORDERS Anxiety CVA negative neurological ROS  negative psych ROS   GI/Hepatic negative GI ROS, Neg liver ROS,   Endo/Other  negative endocrine ROS  Renal/GU negative Renal ROS  negative genitourinary   Musculoskeletal   Abdominal   Peds  Hematology negative hematology ROS (+)   Anesthesia Other Findings   Reproductive/Obstetrics negative OB ROS                             Anesthesia Physical Anesthesia Plan  ASA: III  Anesthesia Plan: MAC   Post-op Pain Management:    Induction:   PONV Risk Score and Plan:   Airway Management Planned:   Additional Equipment:   Intra-op Plan:   Post-operative Plan:   Informed Consent: I have reviewed the patients History and Physical, chart, labs and discussed the procedure including the risks, benefits and alternatives for the proposed anesthesia with the patient or authorized representative who has indicated his/her understanding and acceptance.     Plan Discussed with: CRNA  Anesthesia Plan Comments:         Anesthesia Quick Evaluation

## 2018-06-23 NOTE — Anesthesia Postprocedure Evaluation (Signed)
Anesthesia Post Note  Patient: Heidi Rhodes  Procedure(s) Performed: CATARACT EXTRACTION PHACO AND INTRAOCULAR LENS PLACEMENT ([place]) (Right Eye)  Patient location during evaluation: Short Stay Anesthesia Type: MAC Level of consciousness: awake and alert and patient cooperative Pain management: pain level controlled Vital Signs Assessment: post-procedure vital signs reviewed and stable Respiratory status: spontaneous breathing Cardiovascular status: stable Postop Assessment: no apparent nausea or vomiting Anesthetic complications: no     Last Vitals:  Vitals:   06/23/18 1100  BP: 130/63  Pulse: 60  Resp: 19  Temp: 37.1 C  SpO2: 96%    Last Pain:  Vitals:   06/23/18 1100  TempSrc: Oral  PainSc: 0-No pain                 ADAMS, AMY A

## 2018-06-23 NOTE — Transfer of Care (Signed)
Immediate Anesthesia Transfer of Care Note  Patient: [place]  Procedure(s) Performed: CATARACT EXTRACTION PHACO AND INTRAOCULAR LENS PLACEMENT ([facility]) (Right Eye)  Patient Location: Short Stay  Anesthesia Type:MAC  Level of Consciousness: awake, alert , oriented and patient cooperative  Airway & Oxygen Therapy: Patient Spontanous Breathing  Post-op Assessment: Report given to RN and Post -op Vital signs reviewed and stable  Post vital signs: Reviewed and stable  Last Vitals:  Vitals Value Taken Time  BP    Temp    Pulse    Resp    SpO2      Last Pain:  Vitals:   06/23/18 1100  TempSrc: Oral  PainSc: 0-No pain      Patients Stated Pain Goal: 6 ([id number])  Complications: No apparent anesthesia complications

## 2018-06-23 NOTE — Anesthesia Procedure Notes (Signed)
Procedure Name: MAC Date/Time: 06/23/2018 11:43 AM Performed by: Andree Elk Amy A, CRNA Pre-anesthesia Checklist: Patient identified, Emergency Drugs available, Suction available, Timeout performed and Patient being monitored Patient Re-evaluated:Patient Re-evaluated prior to induction Oxygen Delivery Method: Nasal Cannula

## 2018-06-23 NOTE — H&P (Signed)
I have reviewed the H&P, the patient was re-examined, and I have identified no interval changes in medical condition and plan of care since the history and physical of record  

## 2018-06-23 NOTE — Discharge Instructions (Signed)
Monitored Anesthesia Care, Care After  These instructions provide you with information about caring for yourself after your procedure. Your health care provider may also give you more specific instructions. Your treatment has been planned according to current medical practices, but problems sometimes occur. Call your health care provider if you have any problems or questions after your procedure.  What can I expect after the procedure?  After your procedure, it is common to:   Feel sleepy for several hours.   Feel clumsy and have poor balance for several hours.   Feel forgetful about what happened after the procedure.   Have poor judgment for several hours.   Feel nauseous or vomit.   Have a sore throat if you had a breathing tube during the procedure.    Follow these instructions at home:  For at least 24 hours after the procedure:     Do not:  ? Participate in activities in which you could fall or become injured.  ? Drive.  ? Use heavy machinery.  ? Drink alcohol.  ? Take sleeping pills or medicines that cause drowsiness.  ? Make important decisions or sign legal documents.  ? Take care of children on your own.   Rest.  Eating and drinking   Follow the diet that is recommended by your health care provider.   If you vomit, drink water, juice, or soup when you can drink without vomiting.   Make sure you have little or no nausea before eating solid foods.  General instructions   Have a responsible adult stay with you until you are awake and alert.   Take over-the-counter and prescription medicines only as told by your health care provider.   If you smoke, do not smoke without supervision.   Keep all follow-up visits as told by your health care provider. This is important.  Contact a health care provider if:   You keep feeling nauseous or you keep vomiting.   You feel light-headed.   You develop a rash.   You have a fever.  Get help right away if:   You have trouble breathing.  This information is  not intended to replace advice given to you by your health care provider. Make sure you discuss any questions you have with your health care provider.  Document Released: 02/19/2016 Document Revised: 06/20/2016 Document Reviewed: 02/19/2016  Elsevier Interactive Patient Education  2018 Elsevier Inc.

## 2018-06-24 ENCOUNTER — Encounter (HOSPITAL_COMMUNITY): Payer: Self-pay | Admitting: Ophthalmology

## 2018-07-17 NOTE — Progress Notes (Addendum)
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  Sleep study positive for obstructive sleep apnea.  She remains on aspirin for secondary stroke prevention without further stroke or TIA symptoms.  She has minimal bruising and no bleeding.  Blood pressure in the office today 134/71.  She remains on Crestor without myalgias.  She continues to exercise 4 times a week about 30 minutes.  She recently had both cataracts removed.  CPAP compliance dated 06/21/2018-07/20/2018 shows greater than 4 hours for 19 days at 63%.  Less than 4 hours 11 days at 37%.  Average usage 4 hours 29 minutes.  Set pressure 8 cm no leak AHI 1.2.  ESS 6.  Her compliance is less than when last seen by Dr. Rexene Alberts.   Patient states she goes to bed with the mask on but sometimes during the night will pull it off and turn over on her stomach to sleep.  If she gets up to go the bathroom she may or may not put it back on.  She returns for reevaluation    UPDATE 3/7/2019CM Heidi Rhodes, 67 year old female returns for follow-up with history of stroke in November 2018 she is currently on aspirin 325 without further stroke or TIA symptoms.  She has minimal bruising and no bleeding.  Blood pressure in the office today 138/80.  She claims she is compliant with her blood pressure medications 2D echo after last visit 55-60%.  Cardiac event monitor unremarkable.  MRA 10/17/2017: 1. Recent left midbrain infarct with smooth and widely patent basilar and proximal left PCA. 2. Intracranial atherosclerosis with: High-grade bilateral mid P2 segment stenosis. High-grade right inferior division M2 stenosis. 40%  left V4 segment stenosis after the PICA branching Recent sleep study confirms obstructive sleep apnea.  She has not started her CPAP.  She had labs drawn yesterday by primary care, lipid panel was drawn according to patient.  She does not know the results but has an appointment next Monday.  She has had more blurred vision recently and has an appointment to see her ophthalmologist in 2 weeks.  She returns for reevaluation without further neurologic symptoms. 10/09/17 VP48 year old female with hypertension, here for evaluation of stroke.  November 18 patient was at a funeral home when she noticed right leg weakness.  November 20 she had worsening right leg weakness and then her balance was off.  She had some vertigo sensation.  November 21 she went to PCP who ordered a CT scan.  Slight abnormality was noted in left parietal region. On November 22nd patient had increasing slurred speech.  On November 26 patient MRI which confirmed subacute left lower midbrain, upper pons ischemic infarction.  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  Since that time symptoms have stabilized and slightly improved.    REVIEW OF SYSTEMS: Full 14 system review of systems performed and notable only for those listed, all others are neg:  Constitutional: neg  Cardiovascular: neg Ear/Nose/Throat: neg  Skin: neg Eyes: Blurred vision Respiratory: neg Gastroitestinal: neg  Hematology/Lymphatic: neg  Endocrine: neg Musculoskeletal:neg Allergy/Immunology: neg Neurological: History of stroke Psychiatric: neg Sleep : Obstructive sleep apnea with CPAP   ALLERGIES: No Known Allergies  HOME MEDICATIONS: Outpatient  Medications Prior to Visit  Medication Sig Dispense Refill  . amLODipine (NORVASC) 5 MG tablet Take 5 mg by mouth daily.    Marland Kitchen aspirin 325 MG tablet Take 325 mg by mouth daily.    . Biotin 5000 MCG CAPS Take 5,000 mcg by mouth daily.    . Calcium Carbonate-Vitamin D (CALCIUM 600+D HIGH POTENCY PO)  Take by mouth.    . cholestyramine (QUESTRAN) 4 g packet Take 2 g by mouth daily.    . hydrochlorothiazide (HYDRODIURIL) 25 MG tablet Take 25 mg by mouth daily.    Marland Kitchen levOCARNitine (CARNITINE PO) Take 500 mg by mouth daily.    Marland Kitchen lisinopril (PRINIVIL,ZESTRIL) 10 MG tablet Take 10 mg by mouth daily.    . mirabegron ER (MYRBETRIQ) 25 MG TB24 tablet Take 25 mg by mouth at bedtime.     . Multiple Vitamin (MULTIVITAMIN) tablet Take 1 tablet by mouth daily.    . Multiple Vitamins-Minerals (AIRBORNE PO) Take 1 tablet by mouth daily.     Marland Kitchen OVER THE COUNTER MEDICATION Take 1 tablet by mouth daily. Focus Factor otc supplement    . Pancrelipase, Lip-Prot-Amyl, (ZENPEP PO) Take 1 capsule by mouth 4 (four) times daily as needed (when eating greasy or sweet foods). 20,000 - 68000 - 109000    . Probiotic Product (PROBIOTIC DAILY PO) Take 1 capsule by mouth daily.     . rosuvastatin (CRESTOR) 5 MG tablet Take 5 mg by mouth daily.     No facility-administered medications prior to visit.     PAST MEDICAL HISTORY: Past Medical History:  Diagnosis Date  . Anxiety    about being put to sleep for  surgery  . Arthritis   . Gallbladder disorder   . High cholesterol   . HTN (hypertension)   . Sleep apnea   . Stroke [facility])     PAST SURGICAL HISTORY: Past Surgical History:  Procedure Laterality Date  . APPENDECTOMY    . CATARACT EXTRACTION W/PHACO Left 06/02/2018   Procedure: CATARACT EXTRACTION PHACO AND INTRAOCULAR LENS PLACEMENT LEFT EYE;  Surgeon: Tonny Branch, MD;  Location: [facility];  Service: Ophthalmology;  Laterality: Left;  CDE: 8.40  . CATARACT EXTRACTION W/PHACO Right 06/23/2018   Procedure: CATARACT EXTRACTION PHACO AND INTRAOCULAR LENS PLACEMENT ([facility]);  Surgeon: Tonny Branch, MD;  Location: [facility];  Service: Ophthalmology;  Laterality: Right;  CDE: 9.94  . ceasarean section    . CHOLECYSTECTOMY  06/16/2012   Procedure: LAPAROSCOPIC CHOLECYSTECTOMY;  Surgeon: Scherry Ran, MD;  Location: [facility];   Service: General;  Laterality: N/A;  . COLONOSCOPY  02/13/2012   Procedure: COLONOSCOPY;  Surgeon: Rogene Houston, MD;  Location: [facility];  Service: Endoscopy;  Laterality: N/A;  930 possibly earlier for antibiotics  . Left knee replacement     2012  . TOTAL KNEE ARTHROPLASTY  04/18/2012   Procedure: TOTAL KNEE ARTHROPLASTY;  Surgeon: Yvette Rack., MD;  Location: [facility];  Service: Orthopedics;  Laterality: Right;  with revison tibia    FAMILY HISTORY: Family History  Problem Relation Age of Onset  . Heart attack Father   . Other Brother        car accident  . Anesthesia problems Neg Hx     SOCIAL HISTORY: Social History   Socioeconomic History  . Marital status: Married    Spouse name: Not on file  . Number of children: Not on file  . Years of education: Not on file  . Highest education  level: Not on file  Occupational History  . Not on file  Social Needs  . Financial resource strain: Not on file  . Food insecurity:    Worry: Not on file    Inability: Not on file  . Transportation needs:    Medical: Not on file    Non-medical: Not on file  Tobacco Use  . Smoking status: Never Smoker  . Smokeless tobacco: Never Used  Substance and Sexual Activity  . Alcohol use: No  . Drug use: No  . Sexual activity: Yes    Birth control/protection: None  Lifestyle  . Physical activity:    Days per week: Not on file    Minutes per session: Not on file  . Stress: Not on file  Relationships  . Social connections:    Talks on phone: Not on file    Gets together: Not on file    Attends religious service: Not on file    Active member of club or organization: Not on file    Attends meetings of clubs or organizations: Not on file    Relationship status: Not on file  . Intimate partner violence:    Fear of current or ex partner: Not on file    Emotionally abused: Not on file    Physically abused: Not on file    Forced sexual activity: Not on file  Other Topics Concern  .  Not on file  Social History Narrative   Lives at home with husband.       PHYSICAL EXAM  Vitals:   07/21/18 0940  BP: 134/71  Pulse: 67  Weight: 239 lb 12.8 oz (108.8 kg)  Height: 5\' 6"  (1.676 m)   Body mass index is 38.7 kg/m.  Generalized: Well developed, obese female in no acute distress  Head: normocephalic and atraumatic,. Oropharynx benign  Neck: Supple, no carotid bruits  Cardiac: Regular rate rhythm, no murmur  Musculoskeletal: No deformity   Neurological examination   Mentation: Alert oriented to time, place, history taking. Attention span and concentration appropriate. Recent and remote memory intact.  Follows all commands speech and language fluent.   Cranial nerve II-XII: Pupils were equal round reactive to light extraocular movements were full, visual field were full on confrontational test. Facial sensation and strength were normal. hearing was intact to finger rubbing bilaterally. Uvula tongue midline. head turning and shoulder shrug were normal and symmetric.Tongue protrusion into cheek strength was normal. Motor: normal bulk and tone, full strength in the BUE, BLE,  Sensory: normal and symmetric to light touch, pinprick, and  Vibration in the upper and lower extremities  Coordination: finger-nose-finger, heel-to-shin bilaterally, no dysmetria Reflexes: Symmetric upper and lower, plantar responses were flexor bilaterally. Gait and Station: Rising up from seated position without assistance, normal stance,  moderate stride, good arm swing, smooth turning, able to perform tiptoe, and heel walking without difficulty. Tandem gait is steady.  Romberg negative  DIAGNOSTIC DATA (LABS, IMAGING, TESTING) - I reviewed patient records, labs, notes, testing and imaging myself where available.  Lab Results  Component Value Date   WBC 6.3 05/29/2018   HGB 14.3 05/29/2018   HCT 43.5 05/29/2018   MCV 94.6 05/29/2018   PLT 184 05/29/2018      Component Value Date/Time    NA 143 05/29/2018 0849   K 4.0 05/29/2018 0849   CL 107 05/29/2018 0849   CO2 28 05/29/2018 0849   GLUCOSE 101 (H) 05/29/2018 0849   BUN 19 05/29/2018 0849  CREATININE 0.63 05/29/2018 0849   CALCIUM 9.1 05/29/2018 0849   PROT 7.1 01/19/2015 0936   ALBUMIN 4.0 01/19/2015 0936   AST 52 (H) 01/19/2015 0936   ALT 78 (H) 01/19/2015 0936   ALKPHOS 58 01/19/2015 0936   BILITOT 0.8 01/19/2015 0936   GFRNONAA >60 05/29/2018 0849   GFRAA >60 05/29/2018 0849    ASSESSMENT AND PLAN 67 y.o. 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  However chronic left parietal ischemic infarct also noted on imaging. MRA of the brain with intracranial atherosclerosis.   30-day event monitor negative.  2D echo 55-60%.  Sleep study positive for obstructive sleep apnea.CPAP compliance dated 06/21/2018-07/20/2018 shows greater than 4 hours for 19 days at 63%.  Less than 4 hours 11 days at 37%.  Average usage 4 hours 29 minutes.  Set pressure 8 cm no leak AHI 1.2.  ESS 6.    She returns for reevaluation       PLAN: Stressed the importance of management of risk factors to prevent further stroke Continue Aspirin 325mg   for secondary stroke prevention Maintain strict control of hypertension with blood pressure goal below 130/90, today's reading 134/71 continue antihypertensive medications Control of diabetes with hemoglobin A1c below 6.5 followed by primary care  Cholesterol with LDL cholesterol less than 70, followed by primary care, continue Crestor  CPAP, 63% greater than 4 hours  Her compliance has declined a little bit. She finds it harder to put the CPAP back on when she goes to the bathroom and is encouraged to keep using CPAP consistently. She is commended for her treatment adherence. She indicates intention to continue with treatment at this time. Continue exercise by walking, 30 min daily  eat healthy diet with whole grains,   fresh fruits and vegetables Follow up 6 months will discharge from stroke clinic but pt will continue to follow with sleep.  I spent 25 minutes in total face to face time with the patient more than 50% of which was spent counseling and coordination of care, reviewing test results reviewing medications and discussing and reviewing the diagnosis of stroke and management of risk factors and further treatment options.  CPAP compliance report reviewed with patient.  Answered additional questions. Dennie Bible, [facility], [facility], APRN  [facility] [address], [place], [place] [address] [phone]  I reviewed the above note and documentation by the Nurse Practitioner and agree with the history, physical exam, assessment and plan as outlined above. I was immediately available for face-to-face consultation. Star Age, MD, PhD [facility])

## 2018-07-20 ENCOUNTER — Encounter: Payer: Self-pay | Admitting: Nurse Practitioner

## 2018-07-21 ENCOUNTER — Encounter: Payer: Self-pay | Admitting: Nurse Practitioner

## 2018-07-21 ENCOUNTER — Ambulatory Visit: Payer: Medicare Other | Admitting: Nurse Practitioner

## 2018-07-21 VITALS — BP 134/71 | HR 67 | Ht 66.0 in | Wt 239.8 lb

## 2018-07-21 DIAGNOSIS — Z8673 Personal history of transient ischemic attack (TIA), and cerebral infarction without residual deficits: Secondary | ICD-10-CM

## 2018-07-21 DIAGNOSIS — I10 Essential (primary) hypertension: Secondary | ICD-10-CM | POA: Diagnosis not present

## 2018-07-21 DIAGNOSIS — E785 Hyperlipidemia, unspecified: Secondary | ICD-10-CM | POA: Diagnosis not present

## 2018-07-21 DIAGNOSIS — G4733 Obstructive sleep apnea (adult) (pediatric): Secondary | ICD-10-CM

## 2018-07-21 NOTE — Patient Instructions (Addendum)
Stressed the importance of management of risk factors to prevent further stroke Continue Aspirin 325mg   for secondary stroke prevention Maintain strict control of hypertension with blood pressure goal below 130/90, today's reading 134/71 continue antihypertensive medications Control of diabetes with hemoglobin A1c below 6.5 followed by primary care  Cholesterol with LDL cholesterol less than 70, followed by primary care, continue Crestor  CPAP, 63%  Continue exercise by walking, 30 min daily  eat healthy diet with whole grains,  fresh fruits and vegetables Follow up 6 months will discharge from stroke clinic but pt will continue to follow with sleep.  Stroke Prevention Some health problems and behaviors may make it more likely for you to have a stroke. Below are ways to lessen your risk of having a stroke.  Be active for at least 30 minutes on most or all days.  Do not smoke. Try not to be around others who smoke.  Do not drink too much alcohol. ? Do not have more than 2 drinks a day if you are a man. ? Do not have more than 1 drink a day if you are a woman and are not pregnant.  Eat healthy foods, such as fruits and vegetables. If you were put on a specific diet, follow the diet as told.  Keep your cholesterol levels under control through diet and medicines. Look for foods that are low in saturated fat, trans fat, cholesterol, and are high in fiber.  If you have diabetes, follow all diet plans and take your medicine as told.  Ask your doctor if you need treatment to lower your blood pressure. If you have high blood pressure (hypertension), follow all diet plans and take your medicine as told by your doctor.  If you are 4-20 years old, have your blood pressure checked every 3-5 years. If you are age 53 or older, have your blood pressure checked every year.  Keep a healthy weight. Eat foods that are low in calories, salt, saturated fat, trans fat, and cholesterol.  Do not take  drugs.  Avoid birth control pills, if this applies. Talk to your doctor about the risks of taking birth control pills.  Talk to your doctor if you have sleep problems (sleep apnea).  Take all medicine as told by your doctor. ? You may be told to take aspirin or blood thinner medicine. Take this medicine as told by your doctor. ? Understand your medicine instructions.  Make sure any other conditions you have are being taken care of.  Get help right away if:  You suddenly lose feeling (you feel numb) or have weakness in your face, arm, or leg.  Your face or eyelid hangs down to one side.  You suddenly feel confused.  You have trouble talking (aphasia) or understanding what people are saying.  You suddenly have trouble seeing in one or both eyes.  You suddenly have trouble walking.  You are dizzy.  You lose your balance or your movements are clumsy (uncoordinated).  You suddenly have a very bad headache and you do not know the cause.  You have new chest pain.  Your heart feels like it is fluttering or skipping a beat (irregular heartbeat). Do not wait to see if the symptoms above go away. Get help right away. Call your local emergency services (911 in [place] Do not drive yourself to the hospital. This information is not intended to replace advice given to you by your health care provider. Make sure you discuss any questions you  with your health care provider. Document Released: 04/29/2012 Document Revised: 04/05/2016 Document Reviewed: 05/01/2013 Elsevier Interactive Patient Education  2018 Elsevier Inc.  

## 2018-11-18 ENCOUNTER — Other Ambulatory Visit: Payer: Self-pay | Admitting: Surgery

## 2018-11-18 DIAGNOSIS — D440 Neoplasm of uncertain behavior of thyroid gland: Secondary | ICD-10-CM

## 2018-11-26 ENCOUNTER — Ambulatory Visit
Admission: RE | Admit: 2018-11-26 | Discharge: 2018-11-26 | Disposition: A | Discharge status: Home / Self Care | Payer: Medicare Other | Source: Ambulatory Visit | Attending: Surgery | Admitting: Surgery

## 2018-11-26 DIAGNOSIS — D440 Neoplasm of uncertain behavior of thyroid gland: Secondary | ICD-10-CM

## 2019-01-13 ENCOUNTER — Encounter: Payer: Self-pay | Admitting: Neurology

## 2019-01-19 ENCOUNTER — Encounter: Payer: Self-pay | Admitting: Neurology

## 2019-01-19 ENCOUNTER — Ambulatory Visit: Payer: Medicare Other | Admitting: Neurology

## 2019-01-19 VITALS — BP 150/80 | HR 69 | Ht 66.0 in | Wt 253.0 lb

## 2019-01-19 DIAGNOSIS — G4733 Obstructive sleep apnea (adult) (pediatric): Secondary | ICD-10-CM | POA: Diagnosis not present

## 2019-01-19 DIAGNOSIS — Z9989 Dependence on other enabling machines and devices: Secondary | ICD-10-CM

## 2019-01-19 DIAGNOSIS — Z789 Other specified health status: Secondary | ICD-10-CM | POA: Diagnosis not present

## 2019-01-19 NOTE — Patient Instructions (Addendum)
I appreciate you continuing the CPAP. You can call back anytime if you would like to proceed with the dental referral for sleep apnea treatment with a dental device aka oral appliance.

## 2019-01-19 NOTE — Progress Notes (Signed)
Subjective:    Patient ID: Heidi Rhodes is a 68 y.o. female.  HPI     Interim history:   Heidi Rhodes is a 68 year old right-handed woman with an underlying medical history of hypertension, hyperlipidemia, stroke including subacute left lower midbrain and upper pons ischemic stroke diagnosed in November 2018, as well as chronic left parietal stroke, anxiety, arthritis with status post bilateral total knee replacement surgeries, and morbid obesity with a BMI of over 40, who presents for follow-up consultation of her obstructive sleep apnea, on treatment with CPAP. The patient is accompanied by her husband today. I last saw her on 04/17/2018, at which time she was not quite fully compliant with her CPAP. We talked about her baseline sleep study and CPAP titration study from January 2019 and February 2019, respectively. She reported that she was still adapting to treatment. In the first 90 days of treatment she had reached a compliance percentage of 72. 4 hours.  She saw Cecille Rubin, nurse practitioner, in the interim on 07/21/2018, at which time her compliance had further declined a little bit. She was encouraged to be fully compliant with treatment.   Today, 01/19/2019: I reviewed her CPAP compliance data from 12/15/2018 through 01/13/2019 which is a total of 30 days, during which time she used her CPAP 30 days with percent used days greater than 4 hours at 87%, indicating very good compliance with an average usage of 4 hours and 28 minutes, residual AHI at goal at 1.2 per hour, leak acceptable on the low side in fact, with the 95th percentile at 5 L/m, on a pressure of 8 cm with EPR of 2. She reports not sleeping well. She is struggling with the CPAP. Her husband adds that she likes to sleep on her stomach and the CPAP does not allow her to do so. She tries to sleep at least 4 hours with the CPAP on and takes it off after that and sleeps without it for the rest of the night. She is trying to  lose weight, compared to her baseline sleep study she's lost about 9 pounds.   The patient's allergies, current medications, family history, past medical history, past social history, past surgical history and problem list were reviewed and updated as appropriate.    Previously (copied from previous notes for reference):   I first met her on 10/28/2017 at the request of Dr. Leta Baptist, at which time she reported snoring and sleep disruption as well as recent stroke in November 2018. I suggested we proceed with sleep study testing. She had a baseline sleep study, followed by a CPAP titration study. I went over her test results with her in detail today. Her baseline sleep study from 12/08/2017 showed a sleep efficiency of 64.6%, sleep latency of 210 minutes which is markedly delayed. She had a delayed REM latency. She had an increased percentage of stage II sleep, absence of slow-wave sleep and REM sleep was 18.8%, overall AHI was 20.4 per hour, average oxygen saturation 98%, nadir was 84%. Based on her test results I suggested we proceed with CPAP therapy and she was advised to return for CPAP titration study. She had this on 01/09/2018. Sleep efficiency was 73.3%, sleep latency 42 minutes, REM latency was also delayed. She had an increased percentage of slow-wave sleep and REM sleep was reduced at 10.6%. She was titrated via full face mask from 5 cm to 8 cm. AHI was 0 per hour on the final pressure with brief supine REM sleep  achieved an O2 nadir of 88%. Based on her test results I suggested CPAP therapy at home at a pressure of 8 cm.   I reviewed her CPAP compliance data from 03/17/2018 through 04/15/2018 which is a total of 30 days, during which time she used her CPAP every night with percent used days greater than 4 hours at 63%, indicating mildly suboptimal compliance with an average usage of 5 hours and 19 minutes, residual AHI at goal at 1.8 per hour, leak low with the 95th percentile at 4.9 L/m on a  pressure of 8 cm with EPR of 2.    10/28/2017: (She) reports snoring and some sleep disruption. I reviewed your office note from 10/09/2017. She is a nonsmoker and does not utilize alcohol. She drinks caffeine in the form of coffee, usually one cup per day. She is married and lives with her husband. They have 3 children. She is retired. She feels almost back to baseline. Her ESS is 4/24, and fatigue score of 11/63.  She has nocturia, about 1-2 times, rare AM HAs. No Hx of migraines. Has been found to have thyroid nodule. Bedtime is around 10 PM and WT is around 6 AM. She has some residual R sided weakness and maybe some speech issues. She has otherwise come along well. She denies RLS symptoms. She is not aware of any family history of OSA. She is a retired Programmer, multimedia. She would be willing to undergo a sleep study but would like to wait until her new insurance kicks in after the first of the year.     Her Past Medical History Is Significant For: Past Medical History:  Diagnosis Date  . Anxiety    about being put to sleep for  surgery  . Arthritis   . Gallbladder disorder   . High cholesterol   . HTN (hypertension)   . Sleep apnea   . Stroke [facility])     Her Past Surgical History Is Significant For: Past Surgical History:  Procedure Laterality Date  . APPENDECTOMY    . CATARACT EXTRACTION W/PHACO Left 06/02/2018   Procedure: CATARACT EXTRACTION PHACO AND INTRAOCULAR LENS PLACEMENT LEFT EYE;  Surgeon: Tonny Branch, MD;  Location: [facility];  Service: Ophthalmology;  Laterality: Left;  CDE: 8.40  . CATARACT EXTRACTION W/PHACO Right 06/23/2018   Procedure: CATARACT EXTRACTION PHACO AND INTRAOCULAR LENS PLACEMENT (IOC);  Surgeon: Tonny Branch, MD;  Location: [facility];  Service: Ophthalmology;  Laterality: Right;  CDE: 9.94  . ceasarean section    . CHOLECYSTECTOMY  06/16/2012   Procedure: LAPAROSCOPIC CHOLECYSTECTOMY;  Surgeon: Scherry Ran, MD;  Location: [facility];  Service: General;   Laterality: N/A;  . COLONOSCOPY  02/13/2012   Procedure: COLONOSCOPY;  Surgeon: Rogene Houston, MD;  Location: [facility];  Service: Endoscopy;  Laterality: N/A;  930 possibly earlier for antibiotics  . Left knee replacement     2012  . TOTAL KNEE ARTHROPLASTY  04/18/2012   Procedure: TOTAL KNEE ARTHROPLASTY;  Surgeon: Yvette Rack., MD;  Location: [place];  Service: Orthopedics;  Laterality: Right;  with revison tibia    Her Family History Is Significant For: Family History  Problem Relation Age of Onset  . Heart attack Father   . Other Brother        car accident  . Anesthesia problems Neg Hx     Her Social History Is Significant For: Social History   Socioeconomic History  . Marital status: Married  Spouse name: Not on file  . Number of children: Not on file  . Years of education: Not on file  . Highest education level: Not on file  Occupational History  . Not on file  Social Needs  . Financial resource strain: Not on file  . Food insecurity:    Worry: Not on file    Inability: Not on file  . Transportation needs:    Medical: Not on file    Non-medical: Not on file  Tobacco Use  . Smoking status: Never Smoker  . Smokeless tobacco: Never Used  Substance and Sexual Activity  . Alcohol use: No  . Drug use: No  . Sexual activity: Yes    Birth control/protection: None  Lifestyle  . Physical activity:    Days per week: Not on file    Minutes per session: Not on file  . Stress: Not on file  Relationships  . Social connections:    Talks on phone: Not on file    Gets together: Not on file    Attends religious service: Not on file    Active member of club or organization: Not on file    Attends meetings of clubs or organizations: Not on file    Relationship status: Not on file  Other Topics Concern  . Not on file  Social History Narrative   Lives at home with husband.      Her Allergies Are:  No Known Allergies:   Her Current Medications Are:  Outpatient  Encounter Medications as of 01/19/2019  Medication Sig  . amLODipine (NORVASC) 5 MG tablet Take 5 mg by mouth daily.  Marland Kitchen aspirin 325 MG tablet Take 325 mg by mouth daily.  . Biotin 5000 MCG CAPS Take 5,000 mcg by mouth daily.  . Calcium Carbonate-Vitamin D (CALCIUM 600+D HIGH POTENCY PO) Take by mouth.  . cholestyramine (QUESTRAN) 4 g packet Take 2 g by mouth daily.  . hydrochlorothiazide (HYDRODIURIL) 25 MG tablet Take 25 mg by mouth daily.  Marland Kitchen lisinopril (PRINIVIL,ZESTRIL) 10 MG tablet Take 10 mg by mouth daily.  . mirabegron ER (MYRBETRIQ) 25 MG TB24 tablet Take 25 mg by mouth at bedtime.   . Multiple Vitamin (MULTIVITAMIN) tablet Take 1 tablet by mouth daily.  . Multiple Vitamins-Minerals (AIRBORNE PO) Take 1 tablet by mouth daily.   Marland Kitchen OVER THE COUNTER MEDICATION Take 1 tablet by mouth daily. Focus Factor otc supplement  . Pancrelipase, Lip-Prot-Amyl, (ZENPEP PO) Take 1 capsule by mouth 4 (four) times daily as needed (when eating greasy or sweet foods). 20,000 - 68000 - 109000  . Probiotic Product (PROBIOTIC DAILY PO) Take 1 capsule by mouth daily.   . rosuvastatin (CRESTOR) 5 MG tablet Take 5 mg by mouth daily.  . [DISCONTINUED] levOCARNitine (CARNITINE PO) Take 500 mg by mouth daily.   No facility-administered encounter medications on file as of 01/19/2019.   :  Review of Systems:  Out of a complete 14 point review of systems, all are reviewed and negative with the exception of these symptoms as listed below:  Review of Systems  Neurological:       Pt presents today to discuss her cpap. Pt reports that she does not get a good night's sleep with the cpap but tries to use it compliantly.    Objective:  Neurological Exam  Physical Exam Physical Examination:   Vitals:   01/19/19 1048  BP: (!) 150/80  Pulse: 69   General Examination: The patient is a very pleasant 68  y.o. female in no acute distress. She appears well-developed and well-nourished and well groomed.    HEENT:Normocephalic, atraumatic, pupils are equal, round and reactive to light and accommodation.bilateral cataracts noted. She has corrective eyeglasses in place.Extraocular tracking is good without limitation to gaze excursion or nystagmus noted. Normal smooth pursuit is noted. Hearing is grossly intact. Face is symmetric with normal facial animation and normal facial sensation. Speech is clear with no dysarthria noted. Airway examination reveals no significant mouth dryness, adequate dental hygiene, mild airway crowding. Tongue and palate are symmetrical.  Chest:Clear to auscultation without wheezing, rhonchi or crackles noted.  Heart:S1+S2+0, regular and normal without murmurs, rubs or gallops noted.   Abdomen:Soft, non-tender and non-distended with normal bowel sounds appreciated on auscultation.  Extremities:There istrace edema, left more than R.  Skin: Warm and dry without trophic changes noted.  Musculoskeletal: exam reveals no obvious joint deformities, tenderness or joint swelling or erythema, s/p b/l TKA.   Neurologically:  Mental status: The patient is awake, alert and oriented in all 4 spheres.Herimmediate and remote memory, attention, language skills and fund of knowledge are appropriate. There is no evidence of aphasia, agnosia, apraxia or anomia. Speech is clear with normal prosody and enunciation. Thought process is linear. Mood is normaland affect is normal.  Cranial nerves II - XII are as described above under HEENT exam.  Motor exam: Normal bulk, strength and tone is noted. Fine motor skills and coordination: grossly intact.  Cerebellar testing: No dysmetria or intention tremor on finger to nose testing.  Sensory exam: intact to light touch in the upper and lower extremities.  Gait, station and balance:Shestands withminimaldifficulty. No veering to one side is noted. No leaning to one side is noted. Posture is age-appropriate and stance issomewhat  wider based.    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 She was diagnosed with moderate obstructive sleep apnea with a baseline sleep study in January 2019, followed by a CPAP titration study in February 2019. She had some telltale improvement subjectively during her sleep study with treatment with CPAP. While she is still compliant with treatment, she reports difficulty using CPAP, she does not feel as well rested. She has trouble sleeping in the referred sleep position which is on her stomach. She uses CPAP barely 4 hours plus and takes it off in the middle of the night to sleep some without it. I explained to the patient and her husband importance of treating moderate sleep apnea as part of reducing vascular risks for complications and as part of secondary stroke prevention. She is commended for treatment adherence. We talked about alternative treatment options. She is encouraged to continue to try to pursue weight loss. We talked about the possibility of referring her to dentistry for consideration of an oral appliance. There daughter is a Copywriter, advertising and should they would like to talk to her for her input first and see if she would recommend a certain dentist, otherwise we can certainly make a referral. The patient is encouraged to call for this. Otherwise, we plan to follow her routinely in 6 months for sleep apnea. Of note, she was excellent with  her compliance early on during the second month of her treatment between 02/21/2018 and 03/22/2018 during  which time she had a more than 4 hours  Usage percentage of 90%. Average usage was 6 hours and 20 minutes at the time. Her compliance has declined with time. She finds it harder to put the CPAP back on when she goes to the bathroom. I answered all her questions today and the patient and her husband were in agreement with the plan.

## 2019-07-21 ENCOUNTER — Encounter: Payer: Self-pay | Admitting: Neurology

## 2019-07-23 ENCOUNTER — Encounter: Payer: Self-pay | Admitting: Neurology

## 2019-07-23 ENCOUNTER — Other Ambulatory Visit: Payer: Self-pay

## 2019-07-23 ENCOUNTER — Ambulatory Visit (INDEPENDENT_AMBULATORY_CARE_PROVIDER_SITE_OTHER): Payer: Medicare Other | Admitting: Neurology

## 2019-07-23 VITALS — BP 136/74 | HR 75 | Ht 66.0 in | Wt 254.0 lb

## 2019-07-23 DIAGNOSIS — Z789 Other specified health status: Secondary | ICD-10-CM | POA: Diagnosis not present

## 2019-07-23 DIAGNOSIS — Z9989 Dependence on other enabling machines and devices: Secondary | ICD-10-CM | POA: Diagnosis not present

## 2019-07-23 DIAGNOSIS — G4733 Obstructive sleep apnea (adult) (pediatric): Secondary | ICD-10-CM

## 2019-07-23 NOTE — Progress Notes (Signed)
Order for mask refit sent to [facility] via community message. Confirmation received that the order transmitted was successful.  

## 2019-07-23 NOTE — Progress Notes (Signed)
Subjective:    Patient ID: Heidi Rhodes is a 68 y.o. female.  HPI     Interim history:   Heidi Rhodes is a 68 year old right-handed woman with an underlying medical history of hypertension, hyperlipidemia, stroke (subacute left lower midbrain and upper pons ischemic stroke diagnosed in November 2018, as well as chronic left parietal stroke), anxiety, arthritis with status post bilateral total knee replacement surgeries, and morbid obesity with a BMI of over 40, who presents for follow-up consultation of her obstructive sleep apnea, on treatment with CPAP. The patient is unaccompanied today. I last saw her on 01/19/2019, at which time she was having difficulty tolerating the CPAP because she was not able to sleep on her stomach which was her preferred sleep position.  She was struggling to keep it on for 4 hours.  She was trying to lose weight.  She had lost just a little bit of weight compared to when she had her sleep study.  We talked about alternative treatment options including an oral appliance, she wanted to talk to her daughter first who is a Copywriter, advertising.  She was advised to follow-up in 6 months.   Today, 07/23/2019: I reviewed her CPAP compliance data from 06/22/2019 through 07/21/2019 which is a total of 30 days, during which time she used her machine 22 days with essentially no usage since 07/14/2019.  Average usage of 4 hours and 38 minutes for days on treatment and percent use days greater than 4 hours at 63%, suboptimal, residual AHI 1.7/h, leak on the low side with a 95th percentile at 2.6 L/min on a pressure of 8 cm with EPR of 2.  She reports That she recently developed a cough which made it difficult for her to use her CPAP.  Her cough has resolved on its own but she noticed that she was sleeping better without her CPAP, felt better rested.  She did not go back to using her CPAP in the past 9 or 10 days.  She finds the full facemask uncomfortable, she has a pressure area or callus  almost on the nasal bridge and the side of her nose from the mask and that is where her eyeglasses also sit.  She is interested in perhaps getting a nasal mask or a different style.  She declines a referral to dentistry for consideration of an oral appliance for treatment of her sleep apnea.  She is advised that she has moderate obstructive sleep apnea and it should be treated especially in light of her stroke history.   The patient's allergies, current medications, family history, past medical history, past social history, past surgical history and problem list were reviewed and updated as appropriate.    Previously (copied from previous notes for reference):    I saw her on 04/17/2018, at which time she was not quite fully compliant with her CPAP. We talked about her baseline sleep study and CPAP titration study from January 2019 and February 2019, respectively. She reported that she was still adapting to treatment. In the first 90 days of treatment she had reached a compliance percentage of 72. 4 hours.   She saw Cecille Rubin, nurse practitioner, in the interim on 07/21/2018, at which time her compliance had further declined a little bit. She was encouraged to be fully compliant with treatment.    I reviewed her CPAP compliance data from 12/15/2018 through 01/13/2019 which is a total of 30 days, during which time she used her CPAP 30 days with percent  used days greater than 4 hours at 87%, indicating very good compliance with an average usage of 4 hours and 28 minutes, residual AHI at goal at 1.2 per hour, leak acceptable on the low side in fact, with the 95th percentile at 5 L/m, on a pressure of 8 cm with EPR of 2.   I first met her on 10/28/2017 at the request of Dr. Leta Baptist, at which time she reported snoring and sleep disruption as well as recent stroke in November 2018. I suggested we proceed with sleep study testing. She had a baseline sleep study, followed by a CPAP titration study. I went  over her test results with her in detail today. Her baseline sleep study from 12/08/2017 showed a sleep efficiency of 64.6%, sleep latency of 210 minutes which is markedly delayed. She had a delayed REM latency. She had an increased percentage of stage II sleep, absence of slow-wave sleep and REM sleep was 18.8%, overall AHI was 20.4 per hour, average oxygen saturation 98%, nadir was 84%. Based on her test results I suggested we proceed with CPAP therapy and she was advised to return for CPAP titration study. She had this on 01/09/2018. Sleep efficiency was 73.3%, sleep latency 42 minutes, REM latency was also delayed. She had an increased percentage of slow-wave sleep and REM sleep was reduced at 10.6%. She was titrated via full face mask from 5 cm to 8 cm. AHI was 0 per hour on the final pressure with brief supine REM sleep achieved an O2 nadir of 88%. Based on her test results I suggested CPAP therapy at home at a pressure of 8 cm.   I reviewed her CPAP compliance data from 03/17/2018 through 04/15/2018 which is a total of 30 days, during which time she used her CPAP every night with percent used days greater than 4 hours at 63%, indicating mildly suboptimal compliance with an average usage of 5 hours and 19 minutes, residual AHI at goal at 1.8 per hour, leak low with the 95th percentile at 4.9 L/m on a pressure of 8 cm with EPR of 2.    10/28/2017: (She) reports snoring and some sleep disruption. I reviewed your office note from 10/09/2017. She is a nonsmoker and does not utilize alcohol. She drinks caffeine in the form of coffee, usually one cup per day. She is married and lives with her husband. They have 3 children. She is retired. She feels almost back to baseline. Her ESS is 4/24, and fatigue score of 11/63.  She has nocturia, about 1-2 times, rare AM HAs. No Hx of migraines. Has been found to have thyroid nodule. Bedtime is around 10 PM and WT is around 6 AM. She has some residual R sided weakness  and maybe some speech issues. She has otherwise come along well. She denies RLS symptoms. She is not aware of any family history of OSA. She is a retired Programmer, multimedia. She would be willing to undergo a sleep study but would like to wait until her new insurance kicks in after the first of the year.  Her Past Medical History Is Significant For: Past Medical History:  Diagnosis Date  . Anxiety    about being put to sleep for  surgery  . Arthritis   . Gallbladder disorder   . High cholesterol   . HTN (hypertension)   . Sleep apnea   . Stroke [facility])     Her Past Surgical History Is Significant For: Past Surgical History:  Procedure Laterality Date  . APPENDECTOMY    . CATARACT EXTRACTION W/PHACO Left 06/02/2018   Procedure: CATARACT EXTRACTION PHACO AND INTRAOCULAR LENS PLACEMENT LEFT EYE;  Surgeon: Tonny Branch, MD;  [facility]: [facility];  Service: Ophthalmology;  Laterality: Left;  CDE: 8.40  . CATARACT EXTRACTION W/PHACO Right 06/23/2018   Procedure: CATARACT EXTRACTION PHACO AND INTRAOCULAR LENS PLACEMENT (IOC);  Surgeon: Tonny Branch, MD;  Location: [facility];  Service: Ophthalmology;  Laterality: Right;  CDE: 9.94  . ceasarean section    . CHOLECYSTECTOMY  06/16/2012   Procedure: LAPAROSCOPIC CHOLECYSTECTOMY;  Surgeon: Scherry Ran, MD;  Location: [facility];  Service: General;  Laterality: N/A;  . COLONOSCOPY  02/13/2012   Procedure: COLONOSCOPY;  Surgeon: Rogene Houston, MD;  [facility];  Service: Endoscopy;  Laterality: N/A;  930 possibly earlier for antibiotics  . Left knee replacement     2012  . TOTAL KNEE ARTHROPLASTY  04/18/2012   Procedure: TOTAL KNEE ARTHROPLASTY;  Surgeon: Yvette Rack., MD;  Location: [place];  Service: Orthopedics;  Laterality: Right;  with revison tibia    Her Family History Is Significant For: Family History  Problem Relation Age of Onset  . Heart attack Father   . Other Brother        car accident  . Anesthesia problems Neg Hx      Her Social History Is Significant For: Social History   Socioeconomic History  . Marital status: Married    Spouse name: Not on file  . Number of children: Not on file  . Years of education: Not on file  . Highest education level: Not on file  Occupational History  . Not on file  Social Needs  . Financial resource strain: Not on file  . Food insecurity    Worry: Not on file    Inability: Not on file  . Transportation needs    Medical: Not on file    Non-medical: Not on file  Tobacco Use  . Smoking status: Never Smoker  . Smokeless tobacco: Never Used  Substance and Sexual Activity  . Alcohol use: No  . Drug use: No  . Sexual activity: Yes    Birth control/protection: None  Lifestyle  . Physical activity    Days per week: Not on file    Minutes per session: Not on file  . Stress: Not on file  Relationships  . Social Herbalist on phone: Not on file    Gets together: Not on file    Attends religious service: Not on file    Active member of club or organization: Not on file    Attends meetings of clubs or organizations: Not on file    Relationship status: Not on file  Other Topics Concern  . Not on file  Social History Narrative   Lives at home with husband.      Her Allergies Are:  No Known Allergies:   Her Current Medications Are:  Outpatient Encounter Medications as of 07/23/2019  Medication Sig  . amLODipine (NORVASC) 5 MG tablet Take 5 mg by mouth daily.  Marland Kitchen aspirin 325 MG tablet Take 325 mg by mouth daily.  . Biotin 5000 MCG CAPS Take 5,000 mcg by mouth daily.  . Calcium Carbonate-Vitamin D (CALCIUM 600+D HIGH POTENCY PO) Take by mouth.  . cholestyramine (QUESTRAN) 4 g packet Take 2 g by mouth daily.  . hydrochlorothiazide (HYDRODIURIL) 25 MG tablet Take 25 mg by mouth daily.  Marland Kitchen  lisinopril (PRINIVIL,ZESTRIL) 10 MG tablet Take 10 mg by mouth daily.  . mirabegron ER (MYRBETRIQ) 25 MG TB24 tablet Take 25 mg by mouth at bedtime.   . Multiple  Vitamin (MULTIVITAMIN) tablet Take 1 tablet by mouth daily.  . Multiple Vitamins-Minerals (AIRBORNE PO) Take 1 tablet by mouth daily.   Marland Kitchen OVER THE COUNTER MEDICATION Take 1 tablet by mouth daily. Focus Factor otc supplement  . Pancrelipase, Lip-Prot-Amyl, (ZENPEP PO) Take 1 capsule by mouth 4 (four) times daily as needed (when eating greasy or sweet foods). 20,000 - 68000 - 109000  . Probiotic Product (PROBIOTIC DAILY PO) Take 1 capsule by mouth daily.   . rosuvastatin (CRESTOR) 5 MG tablet Take 5 mg by mouth daily.   No facility-administered encounter medications on file as of 07/23/2019.   :  Review of Systems:  Out of a complete 14 point review of systems, all are reviewed and negative with the exception of these symptoms as listed below:  Review of Systems  Neurological:       Pt reports that she is having trouble sleeping with her cpap. Pt reports that she recently had a cough and had to stop her cpap.    Objective:  Neurological Exam  Physical Exam Physical Examination:   Vitals:   07/23/19 1018  BP: 136/74  Pulse: 75    General Examination: The patient is a very pleasant 68 y.o. female in no acute distress. She appears well-developed and well-nourished and well groomed.   HEENT:Normocephalic, atraumatic, pupils are equal, round and reactive to light and accommodation.bilateral cataracts noted. She has corrective eyeglasses in place.Slight area of hyperpigmentation, on the nasal bridge, no Sore or rash.  Extraocular tracking is good without limitation to gaze excursion or nystagmus noted. Normal smooth pursuit is noted. Hearing is grossly intact, b/l hearing aids. Face is symmetric with normal facial animation and normal facial sensation. Speech is clear with no dysarthria noted. Airway examination reveals mild mouth dryness, adequate dental hygiene, mild airway crowding. Tongue and palate are symmetrical.  Chest:Clear to auscultation without wheezing, rhonchi or crackles  noted.  Heart:S1+S2+0, regular and normal without murmurs, rubs or gallops noted.   Abdomen:Soft, non-tender and non-distended with normal bowel sounds appreciated on auscultation.  Extremities:There istrace edema noted.   Skin: Warm and dry without trophic changes noted.  Musculoskeletal: exam reveals no obvious joint deformities, tenderness or joint swelling or erythema, s/p b/l TKA.   Neurologically:  Mental status: The patient is awake, alert and oriented in all 4 spheres.Herimmediate and remote memory, attention, language skills and fund of knowledge are appropriate. There is no evidence of aphasia, agnosia, apraxia or anomia. Speech is clear with normal prosody and enunciation. Thought process is linear. Mood is normaland affect is normal.  Cranial nerves II - XII are as described above under HEENT exam.  Motor exam: Normal bulk, strength and tone is noted. Fine motor skills and coordination: grossly intact.  Cerebellar testing: No dysmetria or intention tremor on finger to nose testing.  Sensory exam: intact to light touch in the upper and lower extremities.  Gait, station and balance:Shestands withminimaldifficulty. No veering to one side is noted. No leaning to one side is noted. Posture is age-appropriate and stance issomewhat wider based.   Assessmentand Plan:  [place] a very pleasant 22 year oldfemalewith an underlying medical history of hypertension, hyperlipidemia, stroke, anxiety, arthritis with status post bilateral total knee replacement surgeries, and morbid obesity with a BMI of over 40, whopresents for follow-up  consultation of her obstructive sleep apnea on CPAP. She was diagnosed with moderate obstructive sleep apnea with a baseline sleep study in January 2019, followed by a CPAP titration study in February 2019. She had some telltale improvement subjectively during her sleep study with treatment with CPAP. While she is still  compliant with treatment, she Continues to report difficulty using it and does not feel rested.  She does not currently do well with the full facemask she has.  She has essentially stopped using her CPAP in the past 9 days, she had trouble tolerating it when she developed a cough and thankfully the cough improved on its own.  She is advised that we could seek a dental consultation for consideration of an oral appliance but she would like to hold off.  She would like to try CPAP again.  She is advised that she could try a different mask.  I will request a mask refit through her DME company and she is also advised to call them as soon as possible.  I suggested a follow-up routinely with the nurse practitioner in 6 months for sleep apnea recheck. I answered all her questions today and she was in agreement. I spent 20 minutes in total face-to-face time with the patient, more than 50% of which was spent in counseling and coordination of care, reviewing test results, reviewing medication and discussing or reviewing the diagnosis of OSA, its prognosis and treatment options. Pertinent laboratory and imaging test results that were available during this visit with the patient were reviewed by me and considered in my medical decision making (see chart for details).

## 2019-07-23 NOTE — Patient Instructions (Addendum)
Please continue using your CPAP regularly. While your insurance requires that you use CPAP at least 4 hours each night on 70% of the nights, I recommend, that you not skip any nights and use it throughout the night if you can. Getting used to CPAP and staying with the treatment long term does take time and patience and discipline. Untreated obstructive sleep apnea when it is moderate to severe can have an adverse impact on cardiovascular health and raise her risk for heart disease, arrhythmias, hypertension, congestive heart failure, stroke and diabetes. Untreated obstructive sleep apnea causes sleep disruption, nonrestorative sleep, and sleep deprivation. This can have an impact on your day to day functioning and cause daytime sleepiness and impairment of cognitive function, memory loss, mood disturbance, and problems focussing. Using CPAP regularly can improve these symptoms.  Please keep trying to use her CPAP consistently.  I will request a mask refit through your DME company.  Please also call them from your end.  Follow-up in 6 months with Ward Givens, nurse practitioner.

## 2020-01-20 ENCOUNTER — Ambulatory Visit: Payer: Medicare Other | Admitting: Family Medicine

## 2020-02-02 ENCOUNTER — Other Ambulatory Visit (HOSPITAL_COMMUNITY): Payer: Self-pay | Admitting: Internal Medicine

## 2020-02-02 DIAGNOSIS — Z1231 Encounter for screening mammogram for malignant neoplasm of breast: Secondary | ICD-10-CM

## 2020-02-09 DIAGNOSIS — I10 Essential (primary) hypertension: Secondary | ICD-10-CM | POA: Diagnosis not present

## 2020-02-09 DIAGNOSIS — Z0000 Encounter for general adult medical examination without abnormal findings: Secondary | ICD-10-CM | POA: Diagnosis not present

## 2020-02-09 DIAGNOSIS — J029 Acute pharyngitis, unspecified: Secondary | ICD-10-CM | POA: Diagnosis not present

## 2020-02-09 DIAGNOSIS — G4733 Obstructive sleep apnea (adult) (pediatric): Secondary | ICD-10-CM | POA: Diagnosis not present

## 2020-02-09 DIAGNOSIS — J060 Acute laryngopharyngitis: Secondary | ICD-10-CM | POA: Diagnosis not present

## 2020-02-09 DIAGNOSIS — I639 Cerebral infarction, unspecified: Secondary | ICD-10-CM | POA: Diagnosis not present

## 2020-02-09 DIAGNOSIS — E782 Mixed hyperlipidemia: Secondary | ICD-10-CM | POA: Diagnosis not present

## 2020-02-09 DIAGNOSIS — H538 Other visual disturbances: Secondary | ICD-10-CM | POA: Diagnosis not present

## 2020-02-15 DIAGNOSIS — R7301 Impaired fasting glucose: Secondary | ICD-10-CM | POA: Diagnosis not present

## 2020-02-15 DIAGNOSIS — I10 Essential (primary) hypertension: Secondary | ICD-10-CM | POA: Diagnosis not present

## 2020-02-15 DIAGNOSIS — Z0001 Encounter for general adult medical examination with abnormal findings: Secondary | ICD-10-CM | POA: Diagnosis not present

## 2020-02-15 DIAGNOSIS — G4733 Obstructive sleep apnea (adult) (pediatric): Secondary | ICD-10-CM | POA: Diagnosis not present

## 2020-02-15 DIAGNOSIS — E782 Mixed hyperlipidemia: Secondary | ICD-10-CM | POA: Diagnosis not present

## 2020-02-15 DIAGNOSIS — R7303 Prediabetes: Secondary | ICD-10-CM | POA: Diagnosis not present

## 2020-02-15 DIAGNOSIS — K760 Fatty (change of) liver, not elsewhere classified: Secondary | ICD-10-CM | POA: Diagnosis not present

## 2020-02-15 DIAGNOSIS — R32 Unspecified urinary incontinence: Secondary | ICD-10-CM | POA: Diagnosis not present

## 2020-02-15 DIAGNOSIS — R946 Abnormal results of thyroid function studies: Secondary | ICD-10-CM | POA: Diagnosis not present

## 2020-02-15 DIAGNOSIS — K580 Irritable bowel syndrome with diarrhea: Secondary | ICD-10-CM | POA: Diagnosis not present

## 2020-02-15 DIAGNOSIS — J060 Acute laryngopharyngitis: Secondary | ICD-10-CM | POA: Diagnosis not present

## 2020-02-15 DIAGNOSIS — J029 Acute pharyngitis, unspecified: Secondary | ICD-10-CM | POA: Diagnosis not present

## 2020-02-24 ENCOUNTER — Ambulatory Visit (HOSPITAL_COMMUNITY)
Admission: RE | Admit: 2020-02-24 | Discharge: 2020-02-24 | Disposition: A | Discharge status: Home / Self Care | Payer: Medicare PPO | Source: Ambulatory Visit | Attending: Internal Medicine | Admitting: Internal Medicine

## 2020-02-24 ENCOUNTER — Other Ambulatory Visit: Payer: Self-pay

## 2020-02-24 DIAGNOSIS — Z1231 Encounter for screening mammogram for malignant neoplasm of breast: Secondary | ICD-10-CM | POA: Diagnosis not present

## 2020-03-07 ENCOUNTER — Other Ambulatory Visit: Payer: Self-pay | Admitting: Surgery

## 2020-03-07 DIAGNOSIS — E042 Nontoxic multinodular goiter: Secondary | ICD-10-CM

## 2020-03-07 DIAGNOSIS — D440 Neoplasm of uncertain behavior of thyroid gland: Secondary | ICD-10-CM

## 2020-03-23 ENCOUNTER — Other Ambulatory Visit: Payer: Medicare PPO

## 2020-03-29 ENCOUNTER — Ambulatory Visit
Admission: RE | Admit: 2020-03-29 | Discharge: 2020-03-29 | Disposition: A | Discharge status: Home / Self Care | Payer: Medicare PPO | Source: Ambulatory Visit | Attending: Surgery | Admitting: Surgery

## 2020-03-29 DIAGNOSIS — D440 Neoplasm of uncertain behavior of thyroid gland: Secondary | ICD-10-CM | POA: Diagnosis not present

## 2020-03-29 DIAGNOSIS — E042 Nontoxic multinodular goiter: Secondary | ICD-10-CM | POA: Diagnosis not present

## 2020-03-29 DIAGNOSIS — E041 Nontoxic single thyroid nodule: Secondary | ICD-10-CM | POA: Diagnosis not present

## 2020-04-20 DIAGNOSIS — E042 Nontoxic multinodular goiter: Secondary | ICD-10-CM | POA: Diagnosis not present

## 2020-04-26 DIAGNOSIS — H9313 Tinnitus, bilateral: Secondary | ICD-10-CM | POA: Diagnosis not present

## 2020-04-26 DIAGNOSIS — H9011 Conductive hearing loss, unilateral, right ear, with unrestricted hearing on the contralateral side: Secondary | ICD-10-CM | POA: Diagnosis not present

## 2020-04-29 DIAGNOSIS — H9120 Sudden idiopathic hearing loss, unspecified ear: Secondary | ICD-10-CM | POA: Insufficient documentation

## 2020-04-29 DIAGNOSIS — H903 Sensorineural hearing loss, bilateral: Secondary | ICD-10-CM | POA: Diagnosis not present

## 2020-04-29 DIAGNOSIS — H9113 Presbycusis, bilateral: Secondary | ICD-10-CM | POA: Diagnosis not present

## 2020-05-13 DIAGNOSIS — H903 Sensorineural hearing loss, bilateral: Secondary | ICD-10-CM | POA: Diagnosis not present

## 2020-05-13 DIAGNOSIS — H9113 Presbycusis, bilateral: Secondary | ICD-10-CM | POA: Diagnosis not present

## 2020-05-13 DIAGNOSIS — H9120 Sudden idiopathic hearing loss, unspecified ear: Secondary | ICD-10-CM | POA: Diagnosis not present

## 2020-07-07 DIAGNOSIS — H903 Sensorineural hearing loss, bilateral: Secondary | ICD-10-CM | POA: Diagnosis not present

## 2020-07-07 DIAGNOSIS — H9120 Sudden idiopathic hearing loss, unspecified ear: Secondary | ICD-10-CM | POA: Diagnosis not present

## 2020-07-07 DIAGNOSIS — H9113 Presbycusis, bilateral: Secondary | ICD-10-CM | POA: Diagnosis not present

## 2020-07-12 ENCOUNTER — Other Ambulatory Visit: Payer: Self-pay | Admitting: Otolaryngology

## 2020-07-12 DIAGNOSIS — H9120 Sudden idiopathic hearing loss, unspecified ear: Secondary | ICD-10-CM

## 2020-07-12 DIAGNOSIS — H9113 Presbycusis, bilateral: Secondary | ICD-10-CM

## 2020-08-09 ENCOUNTER — Ambulatory Visit
Admission: RE | Admit: 2020-08-09 | Discharge: 2020-08-09 | Disposition: A | Discharge status: Home / Self Care | Payer: Medicare PPO | Source: Ambulatory Visit | Attending: Otolaryngology | Admitting: Otolaryngology

## 2020-08-09 ENCOUNTER — Other Ambulatory Visit: Payer: Self-pay

## 2020-08-09 DIAGNOSIS — H9190 Unspecified hearing loss, unspecified ear: Secondary | ICD-10-CM | POA: Diagnosis not present

## 2020-08-09 DIAGNOSIS — H9120 Sudden idiopathic hearing loss, unspecified ear: Secondary | ICD-10-CM

## 2020-08-09 DIAGNOSIS — H9113 Presbycusis, bilateral: Secondary | ICD-10-CM

## 2020-08-09 MED ORDER — GADOBENATE DIMEGLUMINE 529 MG/ML IV SOLN
20.0000 mL | Freq: Once | INTRAVENOUS | Status: AC | PRN
  Administered 2020-08-09: 20 mL via INTRAVENOUS

## 2020-08-12 DIAGNOSIS — R42 Dizziness and giddiness: Secondary | ICD-10-CM | POA: Diagnosis not present

## 2020-08-12 DIAGNOSIS — Z0001 Encounter for general adult medical examination with abnormal findings: Secondary | ICD-10-CM | POA: Diagnosis not present

## 2020-08-12 DIAGNOSIS — H9313 Tinnitus, bilateral: Secondary | ICD-10-CM | POA: Diagnosis not present

## 2020-08-12 DIAGNOSIS — Z712 Person consulting for explanation of examination or test findings: Secondary | ICD-10-CM | POA: Diagnosis not present

## 2020-08-12 DIAGNOSIS — R479 Unspecified speech disturbances: Secondary | ICD-10-CM | POA: Diagnosis not present

## 2020-08-12 DIAGNOSIS — H538 Other visual disturbances: Secondary | ICD-10-CM | POA: Diagnosis not present

## 2020-08-12 DIAGNOSIS — Z0000 Encounter for general adult medical examination without abnormal findings: Secondary | ICD-10-CM | POA: Diagnosis not present

## 2020-08-12 DIAGNOSIS — J060 Acute laryngopharyngitis: Secondary | ICD-10-CM | POA: Diagnosis not present

## 2020-08-12 DIAGNOSIS — Z6841 Body Mass Index (BMI) 40.0 and over, adult: Secondary | ICD-10-CM | POA: Diagnosis not present

## 2020-08-19 DIAGNOSIS — R32 Unspecified urinary incontinence: Secondary | ICD-10-CM | POA: Diagnosis not present

## 2020-08-19 DIAGNOSIS — Z23 Encounter for immunization: Secondary | ICD-10-CM | POA: Diagnosis not present

## 2020-08-19 DIAGNOSIS — E782 Mixed hyperlipidemia: Secondary | ICD-10-CM | POA: Diagnosis not present

## 2020-08-19 DIAGNOSIS — R7303 Prediabetes: Secondary | ICD-10-CM | POA: Diagnosis not present

## 2020-08-19 DIAGNOSIS — K760 Fatty (change of) liver, not elsewhere classified: Secondary | ICD-10-CM | POA: Diagnosis not present

## 2020-08-19 DIAGNOSIS — G4733 Obstructive sleep apnea (adult) (pediatric): Secondary | ICD-10-CM | POA: Diagnosis not present

## 2020-08-19 DIAGNOSIS — I10 Essential (primary) hypertension: Secondary | ICD-10-CM | POA: Diagnosis not present

## 2020-08-19 DIAGNOSIS — R946 Abnormal results of thyroid function studies: Secondary | ICD-10-CM | POA: Diagnosis not present

## 2020-10-13 DIAGNOSIS — H90A31 Mixed conductive and sensorineural hearing loss, unilateral, right ear with restricted hearing on the contralateral side: Secondary | ICD-10-CM | POA: Diagnosis not present

## 2020-10-13 DIAGNOSIS — H90A22 Sensorineural hearing loss, unilateral, left ear, with restricted hearing on the contralateral side: Secondary | ICD-10-CM | POA: Diagnosis not present

## 2021-02-13 DIAGNOSIS — G4733 Obstructive sleep apnea (adult) (pediatric): Secondary | ICD-10-CM | POA: Diagnosis not present

## 2021-02-13 DIAGNOSIS — I10 Essential (primary) hypertension: Secondary | ICD-10-CM | POA: Diagnosis not present

## 2021-02-13 DIAGNOSIS — K760 Fatty (change of) liver, not elsewhere classified: Secondary | ICD-10-CM | POA: Diagnosis not present

## 2021-02-13 DIAGNOSIS — K580 Irritable bowel syndrome with diarrhea: Secondary | ICD-10-CM | POA: Diagnosis not present

## 2021-02-13 DIAGNOSIS — E782 Mixed hyperlipidemia: Secondary | ICD-10-CM | POA: Diagnosis not present

## 2021-02-13 DIAGNOSIS — Z6841 Body Mass Index (BMI) 40.0 and over, adult: Secondary | ICD-10-CM | POA: Diagnosis not present

## 2021-02-13 DIAGNOSIS — R7303 Prediabetes: Secondary | ICD-10-CM | POA: Diagnosis not present

## 2021-02-13 DIAGNOSIS — R946 Abnormal results of thyroid function studies: Secondary | ICD-10-CM | POA: Diagnosis not present

## 2021-02-23 ENCOUNTER — Other Ambulatory Visit: Payer: Self-pay | Admitting: Surgery

## 2021-02-23 DIAGNOSIS — E042 Nontoxic multinodular goiter: Secondary | ICD-10-CM

## 2021-02-23 DIAGNOSIS — D440 Neoplasm of uncertain behavior of thyroid gland: Secondary | ICD-10-CM

## 2021-03-07 ENCOUNTER — Ambulatory Visit (INDEPENDENT_AMBULATORY_CARE_PROVIDER_SITE_OTHER): Payer: Medicare PPO | Admitting: Orthopaedic Surgery

## 2021-03-07 ENCOUNTER — Other Ambulatory Visit: Payer: Self-pay

## 2021-03-07 ENCOUNTER — Ambulatory Visit: Payer: Medicare PPO

## 2021-03-07 ENCOUNTER — Encounter: Payer: Self-pay | Admitting: Orthopaedic Surgery

## 2021-03-07 VITALS — BP 164/90 | HR 71 | Ht 66.0 in | Wt 250.0 lb

## 2021-03-07 DIAGNOSIS — M25512 Pain in left shoulder: Secondary | ICD-10-CM

## 2021-03-07 DIAGNOSIS — R7301 Impaired fasting glucose: Secondary | ICD-10-CM | POA: Diagnosis not present

## 2021-03-07 DIAGNOSIS — K760 Fatty (change of) liver, not elsewhere classified: Secondary | ICD-10-CM | POA: Diagnosis not present

## 2021-03-07 DIAGNOSIS — G8929 Other chronic pain: Secondary | ICD-10-CM | POA: Diagnosis not present

## 2021-03-07 DIAGNOSIS — Z6841 Body Mass Index (BMI) 40.0 and over, adult: Secondary | ICD-10-CM

## 2021-03-07 DIAGNOSIS — Z0001 Encounter for general adult medical examination with abnormal findings: Secondary | ICD-10-CM | POA: Diagnosis not present

## 2021-03-07 DIAGNOSIS — Z712 Person consulting for explanation of examination or test findings: Secondary | ICD-10-CM | POA: Diagnosis not present

## 2021-03-07 DIAGNOSIS — Z6838 Body mass index (BMI) 38.0-38.9, adult: Secondary | ICD-10-CM | POA: Diagnosis not present

## 2021-03-07 DIAGNOSIS — I639 Cerebral infarction, unspecified: Secondary | ICD-10-CM | POA: Diagnosis not present

## 2021-03-07 DIAGNOSIS — R7303 Prediabetes: Secondary | ICD-10-CM | POA: Diagnosis not present

## 2021-03-07 NOTE — Progress Notes (Signed)
Subjective:    Patient ID: Heidi Rhodes, female    DOB: Apr 22, 1951, 70 y.o.   MRN: [id number]  HPI She has had increasing pain in the left shoulder and left upper arm for the last three to four months. She has no trauma.  She has pain lifting her arm overhead.  She has pain sleeping on the shoulder on the left.  She has pain now in most movements of the shoulder.  She has found that if she puts her hand on her hip laterally the pain goes away.  She has no swelling, no redness, no numbness. She saw Dr. Nevada Crane about this 02-13-21.  I have reviewed the notes.  She is tired of hurting. She is not taking any NSAIDs.  She has used ice and heat with little help.   Review of Systems  Constitutional: Positive for activity change.  Musculoskeletal: Positive for arthralgias.  Psychiatric/Behavioral: The patient is nervous/anxious.   All other systems reviewed and are negative.  For Review of Systems, all other systems reviewed and are negative.  The following is a summary of the past history medically, past history surgically, known current medicines, social history and family history.  This information is gathered electronically by the computer from prior information and documentation.  I review this each visit and have found including this information at this point in the chart is beneficial and informative.   Past Medical History:  Diagnosis Date  . Anxiety    about being put to sleep for  surgery  . Arthritis   . Gallbladder disorder   . High cholesterol   . HTN (hypertension)   . Sleep apnea   . Stroke [facility])     Past Surgical History:  Procedure Laterality Date  . APPENDECTOMY    . CATARACT EXTRACTION W/PHACO Left 06/02/2018   Procedure: CATARACT EXTRACTION PHACO AND INTRAOCULAR LENS PLACEMENT LEFT EYE;  Surgeon: Tonny Branch, MD;  [facility];  Service: Ophthalmology;  Laterality: Left;  CDE: 8.40  . CATARACT EXTRACTION W/PHACO Right 06/23/2018   Procedure: CATARACT EXTRACTION  PHACO AND INTRAOCULAR LENS PLACEMENT (IOC);  Surgeon: Tonny Branch, MD;  [facility];  Service: Ophthalmology;  Laterality: Right;  CDE: 9.94  . ceasarean section    . CHOLECYSTECTOMY  06/16/2012   Procedure: LAPAROSCOPIC CHOLECYSTECTOMY;  Surgeon: Scherry Ran, MD;  Location: [facility];  Service: General;  Laterality: N/A;  . COLONOSCOPY  02/13/2012   Procedure: COLONOSCOPY;  Surgeon: Rogene Houston, MD;  [facility];  Service: Endoscopy;  Laterality: N/A;  930 possibly earlier for antibiotics  . Left knee replacement     2012  . TOTAL KNEE ARTHROPLASTY  04/18/2012   Procedure: TOTAL KNEE ARTHROPLASTY;  Surgeon: Yvette Rack., MD;  Location: [place];  Service: Orthopedics;  Laterality: Right;  with revison tibia    Current Outpatient Medications on File Prior to Visit  Medication Sig Dispense Refill  . amLODipine (NORVASC) 5 MG tablet Take 5 mg by mouth daily.    Marland Kitchen aspirin 325 MG tablet Take 325 mg by mouth daily.    . Biotin 5000 MCG CAPS Take 5,000 mcg by mouth daily.    . carboxymethylcellulose (REFRESH PLUS) 0.5 % SOLN 1 drop 3 (three) times daily as needed.    . Cholecalciferol (D3 HIGH POTENCY) 125 MCG (5000 UT) capsule Take 5,000 Units by mouth daily.    . cholestyramine (QUESTRAN) 4 g packet Take 2 g by mouth daily.    Marland Kitchen  hydrochlorothiazide (HYDRODIURIL) 25 MG tablet Take 25 mg by mouth daily.    Marland Kitchen lisinopril (PRINIVIL,ZESTRIL) 10 MG tablet Take 10 mg by mouth daily.    . Multiple Vitamin (MULTIVITAMIN) tablet Take 1 tablet by mouth daily.    Marland Kitchen OVER THE COUNTER MEDICATION Take 1 tablet by mouth daily. Focus Factor otc supplement    . Pancrelipase, Lip-Prot-Amyl, (ZENPEP PO) Take 1 capsule by mouth 4 (four) times daily as needed (when eating greasy or sweet foods). 20,000 - 68000 - 109000    . rosuvastatin (CRESTOR) 5 MG tablet Take 5 mg by mouth daily.     No current facility-administered medications on file prior to visit.    Social History   Socioeconomic  History  . Marital status: Married    Spouse name: Not on file  . Number of children: Not on file  . Years of education: Not on file  . Highest education level: Not on file  Occupational History  . Not on file  Tobacco Use  . Smoking status: Never Smoker  . Smokeless tobacco: Never Used  Vaping Use  . Vaping Use: Never used  Substance and Sexual Activity  . Alcohol use: No  . Drug use: No  . Sexual activity: Yes    Birth control/protection: None  Other Topics Concern  . Not on file  Social History Narrative   Lives at home with husband.     Social Determinants of Health   Financial Resource Strain: Not on file  Food Insecurity: Not on file  Transportation Needs: Not on file  Physical Activity: Not on file  Stress: Not on file  Social Connections: Not on file  Intimate Partner Violence: Not on file    Family History  Problem Relation Age of Onset  . Heart attack Father   . Other Brother        car accident  . Anesthesia problems Neg Hx     BP (!) 164/90   Pulse 71   Ht 5\' 6"  (1.676 m)   Wt 250 lb (113.4 kg)   BMI 40.35 kg/m   Body mass index is 40.35 kg/m.      Objective:   Physical Exam Vitals and nursing note reviewed. Exam conducted with a chaperone present.  Constitutional:      Appearance: She is well-developed.  HENT:     Head: Normocephalic and atraumatic.  Eyes:     Conjunctiva/sclera: Conjunctivae normal.     Pupils: Pupils are equal, round, and reactive to light.  Cardiovascular:     Rate and Rhythm: Normal rate and regular rhythm.  Pulmonary:     Effort: Pulmonary effort is normal.  Abdominal:     Palpations: Abdomen is soft.  Musculoskeletal:       Arms:     Cervical back: Normal range of motion and neck supple.  Skin:    General: Skin is warm and dry.  Neurological:     Mental Status: She is alert and oriented to person, place, and time.     Cranial Nerves: No cranial nerve deficit.     Motor: No abnormal muscle tone.      Coordination: Coordination normal.     Deep Tendon Reflexes: Reflexes are normal and symmetric. Reflexes normal.  Psychiatric:        Behavior: Behavior normal.        Thought Content: Thought content normal.        Judgment: Judgment normal.   X-rays were done of the left  shoulder, reported separately.  She has degenerative changes and large osteophyte.       Assessment & Plan:   Encounter Diagnoses  Name Primary?  . Chronic left shoulder pain Yes  . Body mass index 40.0-44.9, adult ([place])   . Morbid obesity ([place])    I have shown her and her husband the X-rays of the shoulder.  I will begin OT.  PROCEDURE NOTE:  The patient request injection, verbal consent was obtained.  The left shoulder was prepped appropriately after time out was performed.   Sterile technique was observed and injection of 1 cc of Celestone 6 mg with several cc's of plain xylocaine. Anesthesia was provided by ethyl chloride and a 20-gauge needle was used to inject the shoulder area. A posterior approach was used.  The injection was tolerated well.  A band aid dressing was applied.  The patient was advised to apply ice later today and tomorrow to the injection sight as needed.  Begin Aleve one bid pc.  Return in two weeks.  She may need MRI.  Call if any problem.  Precautions discussed.   Electronically Signed Sanjuana Kava, MD 4/26/202210:48 AM

## 2021-03-08 ENCOUNTER — Ambulatory Visit
Admission: RE | Admit: 2021-03-08 | Discharge: 2021-03-08 | Disposition: A | Discharge status: Home / Self Care | Payer: Medicare PPO | Source: Ambulatory Visit | Attending: Surgery | Admitting: Surgery

## 2021-03-08 DIAGNOSIS — E041 Nontoxic single thyroid nodule: Secondary | ICD-10-CM | POA: Diagnosis not present

## 2021-03-08 DIAGNOSIS — D440 Neoplasm of uncertain behavior of thyroid gland: Secondary | ICD-10-CM

## 2021-03-08 DIAGNOSIS — E042 Nontoxic multinodular goiter: Secondary | ICD-10-CM

## 2021-03-15 DIAGNOSIS — Z8673 Personal history of transient ischemic attack (TIA), and cerebral infarction without residual deficits: Secondary | ICD-10-CM | POA: Diagnosis not present

## 2021-03-15 DIAGNOSIS — M25512 Pain in left shoulder: Secondary | ICD-10-CM | POA: Diagnosis not present

## 2021-03-15 DIAGNOSIS — G4733 Obstructive sleep apnea (adult) (pediatric): Secondary | ICD-10-CM | POA: Diagnosis not present

## 2021-03-15 DIAGNOSIS — R7301 Impaired fasting glucose: Secondary | ICD-10-CM | POA: Diagnosis not present

## 2021-03-15 DIAGNOSIS — R946 Abnormal results of thyroid function studies: Secondary | ICD-10-CM | POA: Diagnosis not present

## 2021-03-15 DIAGNOSIS — E782 Mixed hyperlipidemia: Secondary | ICD-10-CM | POA: Diagnosis not present

## 2021-03-15 DIAGNOSIS — K580 Irritable bowel syndrome with diarrhea: Secondary | ICD-10-CM | POA: Diagnosis not present

## 2021-03-15 DIAGNOSIS — I10 Essential (primary) hypertension: Secondary | ICD-10-CM | POA: Diagnosis not present

## 2021-03-15 DIAGNOSIS — Z6841 Body Mass Index (BMI) 40.0 and over, adult: Secondary | ICD-10-CM | POA: Diagnosis not present

## 2021-03-21 ENCOUNTER — Other Ambulatory Visit: Payer: Self-pay

## 2021-03-21 ENCOUNTER — Ambulatory Visit: Payer: Medicare PPO | Admitting: Orthopaedic Surgery

## 2021-03-21 ENCOUNTER — Encounter: Payer: Self-pay | Admitting: Orthopaedic Surgery

## 2021-03-21 VITALS — BP 143/122 | HR 71 | Ht 66.0 in | Wt 260.0 lb

## 2021-03-21 DIAGNOSIS — Z6841 Body Mass Index (BMI) 40.0 and over, adult: Secondary | ICD-10-CM

## 2021-03-21 DIAGNOSIS — M25512 Pain in left shoulder: Secondary | ICD-10-CM

## 2021-03-21 DIAGNOSIS — G8929 Other chronic pain: Secondary | ICD-10-CM

## 2021-03-21 NOTE — Progress Notes (Signed)
I am so much better.  She has near full ROM of the left shoulder today. She had injection last time and has been on Aleve.  She has no problem.  NV intact.  Encounter Diagnoses  Name Primary?  . Chronic left shoulder pain Yes  . Body mass index 40.0-44.9, adult ([place])   . Morbid obesity ([place])    I will see as needed.  Call if any problem.  Precautions discussed.   Electronically Signed Sanjuana Kava, MD 5/10/20229:11 AM

## 2021-03-21 NOTE — Patient Instructions (Signed)
Glad you are feeling better, your shoulder may flare,  Dr Luna Glasgow here Tuesdays and Thursdays, and in [place] every other Wednesday. If you need to see him, let us know and we can work you in for an injection  Every day raise your arm up directly above your head to keep it moving do this at least once daily, it may pop but that is okay.

## 2021-04-19 DIAGNOSIS — E042 Nontoxic multinodular goiter: Secondary | ICD-10-CM | POA: Diagnosis not present

## 2021-09-12 DIAGNOSIS — R7301 Impaired fasting glucose: Secondary | ICD-10-CM | POA: Diagnosis not present

## 2021-09-12 DIAGNOSIS — I10 Essential (primary) hypertension: Secondary | ICD-10-CM | POA: Diagnosis not present

## 2021-09-12 DIAGNOSIS — R946 Abnormal results of thyroid function studies: Secondary | ICD-10-CM | POA: Diagnosis not present

## 2021-09-19 DIAGNOSIS — R946 Abnormal results of thyroid function studies: Secondary | ICD-10-CM | POA: Diagnosis not present

## 2021-09-19 DIAGNOSIS — E782 Mixed hyperlipidemia: Secondary | ICD-10-CM | POA: Diagnosis not present

## 2021-09-19 DIAGNOSIS — G4733 Obstructive sleep apnea (adult) (pediatric): Secondary | ICD-10-CM | POA: Diagnosis not present

## 2021-09-19 DIAGNOSIS — K580 Irritable bowel syndrome with diarrhea: Secondary | ICD-10-CM | POA: Diagnosis not present

## 2021-09-19 DIAGNOSIS — Z0001 Encounter for general adult medical examination with abnormal findings: Secondary | ICD-10-CM | POA: Diagnosis not present

## 2021-09-19 DIAGNOSIS — Z8673 Personal history of transient ischemic attack (TIA), and cerebral infarction without residual deficits: Secondary | ICD-10-CM | POA: Diagnosis not present

## 2021-09-19 DIAGNOSIS — I10 Essential (primary) hypertension: Secondary | ICD-10-CM | POA: Diagnosis not present

## 2021-09-19 DIAGNOSIS — M25512 Pain in left shoulder: Secondary | ICD-10-CM | POA: Diagnosis not present

## 2021-09-19 DIAGNOSIS — Z23 Encounter for immunization: Secondary | ICD-10-CM | POA: Diagnosis not present

## 2021-09-20 ENCOUNTER — Encounter (INDEPENDENT_AMBULATORY_CARE_PROVIDER_SITE_OTHER): Payer: Self-pay | Admitting: *Deleted

## 2021-10-16 ENCOUNTER — Other Ambulatory Visit: Payer: Self-pay

## 2021-10-16 ENCOUNTER — Encounter (INDEPENDENT_AMBULATORY_CARE_PROVIDER_SITE_OTHER): Payer: Self-pay | Admitting: Gastroenterology

## 2021-10-16 ENCOUNTER — Ambulatory Visit (INDEPENDENT_AMBULATORY_CARE_PROVIDER_SITE_OTHER): Payer: Medicare PPO | Admitting: Gastroenterology

## 2021-10-16 VITALS — BP 153/83 | HR 72 | Temp 99.2°F | Ht 66.0 in | Wt 257.1 lb

## 2021-10-16 DIAGNOSIS — R197 Diarrhea, unspecified: Secondary | ICD-10-CM | POA: Diagnosis not present

## 2021-10-16 NOTE — Progress Notes (Signed)
Referring Provider: Celene Squibb, MD Primary Care Physician:  Celene Squibb, MD Primary GI Physician: newly established  Chief Complaint  Patient presents with   Irritable Bowel Syndrome    Pt arrives as a new patient. Having trouble IBS with diarrhea. Happens after eating greasy foods. Pt states last colonoscopy was 2011 and would like to get one scheduled.    HPI:   Heidi Rhodes is a 70 y.o. female with past medical history of anxiety, arthritis, High cholesterol, HTN, sleep apnea, IBS and stroke  Patient presenting today as new patient, referred by Dr. Nevada Crane for diarrhea.  Patient reports that she is having diarrhea, usually occurs with greasy foods or sweets. She states that this typically occurs right after she eats these types of foods but sometimes will occur the next day. She reports that she has fecal urgency during these episodes but feels that eating bread or pasta will usually help slow down her diarrhea. If she avoid eating sweets or greasy foods, she will have a formed, normal stool per day. She is currently on cholestyramine and feels that this works well for her. She also is prescribed creon, but only takes two tablets in the morning, she has not ever taken them with every meal or snack. She reports that diarrhea has been ongoing for a long time. She does have lower abdominal cramping when she has these episodes of diarrhea, usually improved after having a BM. She denies melena or blood in stools. She has had no weight loss or changes in appetite. Denies fevers or chills. No dysphagia or odynophagia.  NSAID QMV:HQIONGEXBM aleve,  Social hx:no etoh or tobacco Fam hx: no crc or liver disease  Last Colonoscopy:02/13/12 mild sigmoid diverticulosis, small external hemorrhoids and anal papillae Last Endoscopy:n/a  Recommendations:   Past Medical History:  Diagnosis Date   Anxiety    about being put to sleep for  surgery   Arthritis    Gallbladder disorder    High  cholesterol    HTN (hypertension)    Sleep apnea    Stroke [facility])     Past Surgical History:  Procedure Laterality Date   APPENDECTOMY     CATARACT EXTRACTION W/PHACO Left 06/02/2018   Procedure: CATARACT EXTRACTION PHACO AND INTRAOCULAR LENS PLACEMENT LEFT EYE;  Surgeon: Tonny Branch, MD;  [facility];  Service: Ophthalmology;  Laterality: Left;  CDE: 8.40   CATARACT EXTRACTION W/PHACO Right 06/23/2018   Procedure: CATARACT EXTRACTION PHACO AND INTRAOCULAR LENS PLACEMENT (IOC);  Surgeon: Tonny Branch, MD;  Location: [facility];  Service: Ophthalmology;  Laterality: Right;  CDE: 9.94   ceasarean section     CHOLECYSTECTOMY  06/16/2012   Procedure: LAPAROSCOPIC CHOLECYSTECTOMY;  Surgeon: Scherry Ran, MD;  Location: [facility];  Service: General;  Laterality: N/A;   COLONOSCOPY  02/13/2012   Procedure: COLONOSCOPY;  Surgeon: Rogene Houston, MD;  [facility];  Service: Endoscopy;  Laterality: N/A;  930 possibly earlier for antibiotics   Left knee replacement     2012   TOTAL KNEE ARTHROPLASTY  04/18/2012   Procedure: TOTAL KNEE ARTHROPLASTY;  Surgeon: Yvette Rack., MD;  Location: [place];  Service: Orthopedics;  Laterality: Right;  with revison tibia    Current Outpatient Medications  Medication Sig Dispense Refill   amLODipine (NORVASC) 5 MG tablet Take 5 mg by mouth daily.     aspirin 325 MG tablet Take 325 mg by mouth daily.     Biotin 1000  MCG tablet Take 10,000 mcg by mouth daily.     carboxymethylcellulose (REFRESH PLUS) 0.5 % SOLN 1 drop 3 (three) times daily as needed.     CHOLESTYRAMINE PO Take 2 g by mouth daily.     hydrochlorothiazide (HYDRODIURIL) 25 MG tablet Take 25 mg by mouth daily.     lisinopril (PRINIVIL,ZESTRIL) 10 MG tablet Take 10 mg by mouth daily.     Multiple Vitamin (MULTIVITAMIN) tablet Take 1 tablet by mouth daily.     naproxen sodium (ALEVE) 220 MG tablet Take 220 mg by mouth. Takes prn     OVER THE COUNTER MEDICATION Take 1 tablet by mouth daily.  Prevagin - Focus Factor otc supplement     OVER THE COUNTER MEDICATION Airborn 1000 mg "C"     OVER THE COUNTER MEDICATION Vitamin D3 plus calcium 60mcg/650mg  takes one daily     OVER THE COUNTER MEDICATION L-Carnitine tartrate 1,000 mg - takes one daily     Pancrelipase, Lip-Prot-Amyl, (CREON) 24000-76000 units CPEP Take by mouth. Once in the morning     Probiotic Product (PROBIOTIC-10 PO) Take by mouth.     rosuvastatin (CRESTOR) 5 MG tablet Take 5 mg by mouth daily.     No current facility-administered medications for this visit.    Allergies as of 10/16/2021   (No Known Allergies)    Family History  Problem Relation Age of Onset   Heart attack Father    Other Brother        car accident   Anesthesia problems Neg Hx     Social History   Socioeconomic History   Marital status: Married    Spouse name: Not on file   Number of children: Not on file   Years of education: Not on file   Highest education level: Not on file  Occupational History   Not on file  Tobacco Use   Smoking status: Never   Smokeless tobacco: Never  Vaping Use   Vaping Use: Never used  Substance and Sexual Activity   Alcohol use: No   Drug use: No   Sexual activity: Yes    Birth control/protection: None  Other Topics Concern   Not on file  Social History Narrative   Lives at home with husband.     Social Determinants of Health   Financial Resource Strain: Not on file  Food Insecurity: Not on file  Transportation Needs: Not on file  Physical Activity: Not on file  Stress: Not on file  Social Connections: Not on file   Review of systems General: negative for malaise, night sweats, fever, chills, weight loss Neck: Negative for lumps, goiter, pain and significant neck swelling Resp: Negative for cough, wheezing, dyspnea at rest CV: Negative for chest pain, leg swelling, palpitations, orthopnea GI: denies melena, hematochezia, nausea, vomiting,  constipation, dysphagia, odyonophagia, early  satiety or unintentional weight loss. +diarrhea MSK: Negative for joint pain or swelling, back pain, and muscle pain. Derm: Negative for itching or rash Psych: Denies depression, anxiety, memory loss, confusion. No homicidal or suicidal ideation.  Heme: Negative for prolonged bleeding, bruising easily, and swollen nodes. Endocrine: Negative for cold or heat intolerance, polyuria, polydipsia and goiter. Neuro: negative for tremor, gait imbalance, syncope and seizures. The remainder of the review of systems is noncontributory.  Physical Exam: BP (!) 153/83 (BP Location: Right Arm, Patient Position: Sitting, Cuff Size: Large)   Pulse 72   Temp 99.2 F (37.3 C) (Oral)   Ht 5\' 6"  (1.676 m)  Wt 257 lb 1.6 oz (116.6 kg)   BMI 41.50 kg/m  General:   Alert and oriented. No distress noted. Pleasant and cooperative.  Head:  Normocephalic and atraumatic. Eyes:  Conjuctiva clear without scleral icterus. Mouth:  Oral mucosa pink and moist. Good dentition. No lesions. Heart: Normal rate and rhythm, s1 and s2 heart sounds present.  Lungs: Clear lung sounds in all lobes. Respirations equal and unlabored. Abdomen:  +BS, soft, non-tender and non-distended. No rebound or guarding. No HSM or masses noted. Derm: No palmar erythema or jaundice Msk:  Symmetrical without gross deformities. Normal posture. Extremities:  Without edema. Neurologic:  Alert and  oriented x4 Psych:  Alert and cooperative. Normal mood and affect.  Invalid input(s): 6 MONTHS   ASSESSMENT: Heidi Rhodes is a 70 y.o. female presenting today as new patient for further evaluation of diarrhea.  Patient reports diarrhea has been ongoing for quite a while. She is currently on cholestyramine 2g daily which she feels works well for her and creon 24000 units, but only takes 2 in the mornings. Diarrhea typically only occurs when she eats greasy or sweet foods, otherwise has soft,formed BM per day. She denies any recent abx, no fevers,  chills,recent sick contacts, or travel. I suspect her diarrhea is related to possible pancreatic insufficiency that is not well managed, she could also have some underlying IBS. I have encouraged her to continue cholestyramine and take her creon, 2 tablets with every meal and 1 tablet with snacks to see if this helps her symptoms. She should avoid foods that are triggers for her diarrhea. We will check stool studies to r/o underlying infectious etiology. I am not inclined to do colonoscopy just yet as she has no alarm features, if stool studies are negative and diarrhea continues despite increased creon dosing, we will proceed with colonoscopy, otherwise she is due for screening colonoscopy in April 2023.    PLAN:  2 tablets Creon with every meal and 1 tablet with snack Continue cholestyramine 3. GI path and O&P 4.  Consider colonoscopy if no improvement and stool studies are negative, otherwise colonoscopy in April 2023 5. Pt to let me know if she develops any new GI symptoms  Follow Up: 3 months  Ari Engelbrecht L. Alver Sorrow, MSN, APRN, AGNP-C Adult-Gerontology Nurse Practitioner [facility]

## 2021-10-16 NOTE — Patient Instructions (Signed)
Please start taking your creon, two tablets with every meal and one tablet with snacks, we will also check stool studies to rule out underlying infection. If symptoms do not improve and stools studies are negative we will get you scheduled for colonoscopy. Please let me know if you develop any rectal bleeding, weight loss or changes in your appetite

## 2021-11-09 DIAGNOSIS — R197 Diarrhea, unspecified: Secondary | ICD-10-CM | POA: Diagnosis not present

## 2021-11-14 LAB — GASTROINTESTINAL PATHOGEN PANEL PCR
C. difficile Tox A/B, PCR: NOT DETECTED
Campylobacter, PCR: NOT DETECTED
Cryptosporidium, PCR: NOT DETECTED
E coli (ETEC) LT/ST PCR: NOT DETECTED
E coli (STEC) stx1/stx2, PCR: NOT DETECTED
E coli 0157, PCR: NOT DETECTED
Giardia lamblia, PCR: NOT DETECTED
Norovirus, PCR: NOT DETECTED
Rotavirus A, PCR: NOT DETECTED
Salmonella, PCR: NOT DETECTED
Shigella, PCR: NOT DETECTED

## 2021-11-14 LAB — OVA AND PARASITE EXAMINATION
CONCENTRATE RESULT:: NONE SEEN
MICRO NUMBER:: 12809620
SPECIMEN QUALITY:: ADEQUATE
TRICHROME RESULT:: NONE SEEN

## 2021-11-21 DIAGNOSIS — H26222 Cataract secondary to ocular disorders (degenerative) (inflammatory), left eye: Secondary | ICD-10-CM | POA: Diagnosis not present

## 2021-12-29 DIAGNOSIS — I10 Essential (primary) hypertension: Secondary | ICD-10-CM | POA: Diagnosis not present

## 2021-12-29 DIAGNOSIS — U071 COVID-19: Secondary | ICD-10-CM | POA: Diagnosis not present

## 2022-01-09 DIAGNOSIS — J019 Acute sinusitis, unspecified: Secondary | ICD-10-CM | POA: Diagnosis not present

## 2022-01-09 DIAGNOSIS — U071 COVID-19: Secondary | ICD-10-CM | POA: Diagnosis not present

## 2022-01-09 DIAGNOSIS — J209 Acute bronchitis, unspecified: Secondary | ICD-10-CM | POA: Diagnosis not present

## 2022-01-09 DIAGNOSIS — B349 Viral infection, unspecified: Secondary | ICD-10-CM | POA: Diagnosis not present

## 2022-01-25 ENCOUNTER — Other Ambulatory Visit: Payer: Self-pay

## 2022-01-25 ENCOUNTER — Ambulatory Visit (INDEPENDENT_AMBULATORY_CARE_PROVIDER_SITE_OTHER): Payer: Medicare PPO | Admitting: Gastroenterology

## 2022-01-25 ENCOUNTER — Telehealth (INDEPENDENT_AMBULATORY_CARE_PROVIDER_SITE_OTHER): Payer: Self-pay

## 2022-01-25 ENCOUNTER — Encounter (INDEPENDENT_AMBULATORY_CARE_PROVIDER_SITE_OTHER): Payer: Self-pay

## 2022-01-25 ENCOUNTER — Encounter (INDEPENDENT_AMBULATORY_CARE_PROVIDER_SITE_OTHER): Payer: Self-pay | Admitting: Gastroenterology

## 2022-01-25 ENCOUNTER — Other Ambulatory Visit (INDEPENDENT_AMBULATORY_CARE_PROVIDER_SITE_OTHER): Payer: Self-pay

## 2022-01-25 VITALS — BP 144/67 | HR 78 | Temp 98.2°F | Ht 66.0 in | Wt 254.9 lb

## 2022-01-25 DIAGNOSIS — R197 Diarrhea, unspecified: Secondary | ICD-10-CM

## 2022-01-25 DIAGNOSIS — K8681 Exocrine pancreatic insufficiency: Secondary | ICD-10-CM | POA: Insufficient documentation

## 2022-01-25 DIAGNOSIS — R748 Abnormal levels of other serum enzymes: Secondary | ICD-10-CM

## 2022-01-25 NOTE — Telephone Encounter (Signed)
Denece Shearer Ann Kate Larock, CMA  ?

## 2022-01-25 NOTE — Patient Instructions (Addendum)
Perform blood workup ?Schedule CT pancreas with IV contrast ?Perform blood workup ?Continue Creon 2 pills with meals and 1 pill with snacks ?Stop cholestyramine, can restart if recurrent diarrhea 2 weeks after stopping it ?Schedule colonoscopy ? ?

## 2022-01-25 NOTE — Progress Notes (Signed)
Maylon Peppers, M.D. ?Gastroenterology & Hepatology ?[facility] ?[address] ?[place], [place] [address] ? ?Primary Care Physician: ?Celene Squibb, MD ?[place] ?[place] [address] ? ?I will communicate my assessment and recommendations to the referring MD via EMR. ? ?Problems: ?Exocrine pancreatic insufficiency ?Elevated amniotransferases ? ?History of Present Illness: ?Heidi Rhodes is a 71 y.o. female that medical history of anxiety, hyperlipidemia, hypertension, stroke, OSA, and exocrine pancreatic insufficiency who presents for follow up of EPI. ? ?The patient was last seen on 10/16/2021. At that time, the patient was continued on cholestyramine.Marland Kitchen  She was advised to take Creon on a proper fashion. ? ?States that after making the changes in her Creon, she feels much better. She noticed there was a big improvement in ehr symptoms , as she is having a BM every day. She has pebble like stool ([place] 1) bowel movement every day. Accidentally stopped using cholestyramine for 5 days and she did not have any diarrhea with this.  She denies having any other symptoms and states feeling well. The patient denies having any nausea, vomiting, fever, chills, hematochezia, melena, hematemesis, abdominal distention, abdominal pain, diarrhea, jaundice, pruritus or weight loss. ? ?Notably, the patient had presence of elevated LFTs in the past in 2016 but did not undergo the blood work-up that was ordered in our office. ? ?Had a history of a stroke 325 mg qday. Prescribed by Dr. Nevada Crane. ? ?Last EGD: never ?Last Colonoscopy:02/13/12 mild sigmoid diverticulosis, small external hemorrhoids and anal papillae ? ?Past Medical History: ?Past Medical History:  ?Diagnosis Date  ? Anxiety   ? about being put to sleep for  surgery  ? Arthritis   ? Gallbladder disorder   ? High cholesterol   ? HTN (hypertension)   ? Sleep apnea   ? Stroke [facility])   ? ? ?Past Surgical  History: ?Past Surgical History:  ?Procedure Laterality Date  ? APPENDECTOMY    ? CATARACT EXTRACTION W/PHACO Left 06/02/2018  ? Procedure: CATARACT EXTRACTION PHACO AND INTRAOCULAR LENS PLACEMENT LEFT EYE;  Surgeon: Tonny Branch, MD;  Location: [facility];  Service: Ophthalmology;  Laterality: Left;  CDE: 8.40  ? CATARACT EXTRACTION W/PHACO Right 06/23/2018  ? Procedure: CATARACT EXTRACTION PHACO AND INTRAOCULAR LENS PLACEMENT (IOC);  Surgeon: Tonny Branch, MD;  Location: [facility];  Service: Ophthalmology;  Laterality: Right;  CDE: 9.94  ? ceasarean section    ? CHOLECYSTECTOMY  06/16/2012  ? Procedure: LAPAROSCOPIC CHOLECYSTECTOMY;  Surgeon: Scherry Ran, MD;  Location: [facility];  Service: General;  Laterality: N/A;  ? COLONOSCOPY  02/13/2012  ? Procedure: COLONOSCOPY;  Surgeon: Rogene Houston, MD;  [facility];  Service: Endoscopy;  Laterality: N/A;  930 possibly earlier for antibiotics  ? Left knee replacement    ? 2012  ? TOTAL KNEE ARTHROPLASTY  04/18/2012  ? Procedure: TOTAL KNEE ARTHROPLASTY;  Surgeon: Yvette Rack., MD;  Location: [place];  Service: Orthopedics;  Laterality: Right;  with revison tibia  ? ? ?Family History: ?Family History  ?Problem Relation Age of Onset  ? Heart attack Father   ? Other Brother   ?     car accident  ? Anesthesia problems Neg Hx   ? ? ?Social History: ?Social History  ? ?Tobacco Use  ?Smoking Status Never  ?Smokeless Tobacco Never  ? ?Social History  ? ?Substance and Sexual Activity  ?Alcohol Use No  ? ?Social History  ? ?Substance and  Sexual Activity  ?Drug Use No  ? ? ?Allergies: ?No Known Allergies ? ?Medications: ?Current Outpatient Medications  ?Medication Sig Dispense Refill  ? amLODipine (NORVASC) 5 MG tablet Take 5 mg by mouth daily.    ? aspirin 325 MG tablet Take 325 mg by mouth daily.    ? Biotin 1000 MCG tablet Take 10,000 mcg by mouth daily.    ? carboxymethylcellulose (REFRESH PLUS) 0.5 % SOLN 1 drop 3 (three) times daily as needed.    ? CHOLESTYRAMINE PO Take  2 g by mouth daily.    ? hydrochlorothiazide (HYDRODIURIL) 25 MG tablet Take 25 mg by mouth daily.    ? lisinopril (PRINIVIL,ZESTRIL) 10 MG tablet Take 10 mg by mouth daily.    ? Multiple Vitamin (MULTIVITAMIN) tablet Take 1 tablet by mouth daily.    ? naproxen sodium (ALEVE) 220 MG tablet Take 220 mg by mouth. Takes prn    ? OVER THE COUNTER MEDICATION Take 1 tablet by mouth daily. Prevagin - Focus Factor otc supplement    ? OVER THE COUNTER MEDICATION daily at 6 (six) AM. Airborn 1000 mg "C"    ? OVER THE COUNTER MEDICATION Vitamin D3 plus calcium 39mg/'650mg'$  takes one daily    ? OVER THE COUNTER MEDICATION L-Carnitine tartrate 1,000 mg - takes one daily    ? Pancrelipase, Lip-Prot-Amyl, (CREON) 24000-76000 units CPEP Take by mouth. Before every meal per patient.    ? Probiotic Product (PROBIOTIC-10 PO) Take by mouth.    ? rosuvastatin (CRESTOR) 5 MG tablet Take 5 mg by mouth daily.    ? ?No current facility-administered medications for this visit.  ? ? ?Review of Systems: ?GENERAL: negative for malaise, night sweats ?HEENT: No changes in hearing or vision, no nose bleeds or other nasal problems. ?NECK: Negative for lumps, goiter, pain and significant neck swelling ?RESPIRATORY: Negative for cough, wheezing ?CARDIOVASCULAR: Negative for chest pain, leg swelling, palpitations, orthopnea ?GI: SEE HPI ?MUSCULOSKELETAL: Negative for joint pain or swelling, back pain, and muscle pain. ?SKIN: Negative for lesions, rash ?PSYCH: Negative for sleep disturbance, mood disorder and recent psychosocial stressors. ?HEMATOLOGY Negative for prolonged bleeding, bruising easily, and swollen nodes. ?ENDOCRINE: Negative for cold or heat intolerance, polyuria, polydipsia and goiter. ?NEURO: negative for tremor, gait imbalance, syncope and seizures. ?The remainder of the review of systems is noncontributory. ? ? ?Physical Exam: ?BP (!) 144/67 (BP Location: Left Arm, Patient Position: Sitting, Cuff Size: Large)   Pulse 78   Temp 98.2  ?F (36.8 ?C) (Oral)   Ht '5\' 6"'$  (1.676 m)   Wt 254 lb 14.4 oz (115.6 kg)   BMI 41.14 kg/m?  ?GENERAL: The patient is AO x3, in no acute distress. Obese. ?HEENT: Head is normocephalic and atraumatic. EOMI are intact. Mouth is well hydrated and without lesions. ?NECK: Supple. No masses ?LUNGS: Clear to auscultation. No presence of rhonchi/wheezing/rales. Adequate chest expansion ?HEART: RRR, normal s1 and s2. ?ABDOMEN: Soft, nontender, no guarding, no peritoneal signs, and nondistended. BS +. No masses. ?EXTREMITIES: Without any cyanosis, clubbing, rash, lesions or edema. ?NEUROLOGIC: AOx3, no focal motor deficit. ?SKIN: no jaundice, no rashes ? ?Imaging/Labs: ?as above ? ?I personally reviewed and interpreted the available labs, imaging and endoscopic files. ? ?Impression and Plan: ?Heidi WIENSis a 71y.o. female that medical history of anxiety, hyperlipidemia, hypertension, stroke, OSA, and exocrine pancreatic insufficiency who presents for follow up of EPI.  Patient had improvement of her diarrhea while taking the combination of cholestyramine and Creon.  In  fact, she stopped taking the cholestyramine by accident and did not have recurrence of the diarrhea for short-term.  As she had improvement of her diarrhea while taking the Creon at the refashion, it is possible that she has exocrine pancreatic insufficiency as the primary etiology of her chronic diarrhea.  She continues taking the Creon as advised but we will also look up for any pancreatic abnormalities given the fact that she does not have any risk factors for EPI such as diabetes or smoking.  We will stop cholestyramine for now.  Patient understood and agreed. ? ?Given the presence of mildly elevated aminotransferases in the past we will repeat a CMP and look for acute hepatitis panel.  Depending on the results we may need to proceed with further work-up. ? ?Finally, the patient is due for colorectal cancer screening we will schedule a  colonoscopy. ? ?- Schedule CT pancreas with IV contrast ?-Check CMP and acute hep panel ?- Continue Creon 2 pills with meals and 1 pill with snacks ?- Stop cholestyramine, can restart if recurrent diarrhea 2 weeks after stopping it

## 2022-01-26 ENCOUNTER — Encounter (INDEPENDENT_AMBULATORY_CARE_PROVIDER_SITE_OTHER): Payer: Self-pay

## 2022-02-05 ENCOUNTER — Encounter (INDEPENDENT_AMBULATORY_CARE_PROVIDER_SITE_OTHER): Payer: Self-pay | Admitting: *Deleted

## 2022-02-27 NOTE — Patient Instructions (Signed)
? ? ? ? ? ? Heidi Rhodes ? 02/27/2022  ?  ? '@PREFPERIOPPHARMACY'$ @ ? ? Your procedure is scheduled on  03/02/2022. ? ? Report to [name] at  0730  A.M. ? ? Call this number if you have problems the morning of surgery: ? [phone] ? ? Remember: ? Follow the diet and prep instructions given to you by the office. ?  ? Take these medicines the morning of surgery with A SIP OF WATER  ? ?                                          amlodipine. ?  ? ? Do not wear jewelry, make-up or nail polish. ? Do not wear lotions, powders, or perfumes, or deodorant. ? Do not shave 48 hours prior to surgery.  Men may shave face and neck. ? Do not bring valuables to the hospital. ? [facility] is not responsible for any belongings or valuables. ? ?Contacts, dentures or bridgework may not be worn into surgery.  Leave your suitcase in the car.  After surgery it may be brought to your room. ? ?For patients admitted to the hospital, discharge time will be determined by your treatment team. ? ?Patients discharged the day of surgery will not be allowed to drive home and must have someone with them for 24 hours.  ? ? ?Special instructions:   DO NOT smoke tobacco or vape for 24 hours before your procedure. ? ?Please read over the following fact sheets that you were given. ?Anesthesia Post-op Instructions and Care and Recovery After Surgery ?  ? ? ? Colonoscopy, Adult, Care After ?The following information offers guidance on how to care for yourself after your procedure. Your health care provider may also give you more specific instructions. If you have problems or questions, contact your health care provider. ?What can I expect after the procedure? ?After the procedure, it is common to have: ?A small amount of blood in your stool for 24 hours after the procedure. ?Some gas. ?Mild cramping or bloating of your abdomen. ?Follow these instructions at home: ?Eating and drinking ? ?Drink enough fluid to keep your urine pale yellow. ?Follow  instructions from your health care provider about eating or drinking restrictions. ?Resume your normal diet as told by your health care provider. Avoid heavy or fried foods that are hard to digest. ?Activity ?Rest as told by your health care provider. ?Avoid sitting for a long time without moving. Get up to take short walks every 1-2 hours. This is important to improve blood flow and breathing. Ask for help if you feel weak or unsteady. ?Return to your normal activities as told by your health care provider. Ask your health care provider what activities are safe for you. ?Managing cramping and bloating ? ?Try walking around when you have cramps or feel bloated. ?If directed, apply heat to your abdomen as told by your health care provider. Use the heat source that your health care provider recommends, such as a moist heat pack or a heating pad. ?Place a towel between your skin and the heat source. ?Leave the heat on for 20-30 minutes. ?Remove the heat if your skin turns bright red. This is especially important if you are unable to feel pain, heat, or cold. You have a greater risk of getting burned. ?General instructions ?If you were given a sedative during the  procedure, it can affect you for several hours. Do not drive or operate machinery until your health care provider says that it is safe. ?For the first 24 hours after the procedure: ?Do not sign important documents. ?Do not drink alcohol. ?Do your regular daily activities at a slower pace than normal. ?Eat soft foods that are easy to digest. ?Take over-the-counter and prescription medicines only as told by your health care provider. ?Keep all follow-up visits. This is important. ?Contact a health care provider if: ?You have blood in your stool 2-3 days after the procedure. ?Get help right away if: ?You have more than a small spotting of blood in your stool. ?You have large blood clots in your stool. ?You have swelling of your abdomen. ?You have nausea or  vomiting. ?You have a fever. ?You have increasing pain in your abdomen that is not relieved with medicine. ?These symptoms may be an emergency. Get help right away. Call 911. ?Do not wait to see if the symptoms will go away. ?Do not drive yourself to the hospital. ?Summary ?After the procedure, it is common to have a small amount of blood in your stool. You may also have mild cramping and bloating of your abdomen. ?If you were given a sedative during the procedure, it can affect you for several hours. Do not drive or operate machinery until your health care provider says that it is safe. ?Get help right away if you have a lot of blood in your stool, nausea or vomiting, a fever, or increased pain in your abdomen. ?This information is not intended to replace advice given to you by your health care provider. Make sure you discuss any questions you have with your health care provider. ?Document Revised: 06/21/2021 Document Reviewed: 06/21/2021 ?Elsevier Patient Education ? [place]. ?Monitored Anesthesia Care, Care After ?This sheet gives you information about how to care for yourself after your procedure. Your health care provider may also give you more specific instructions. If you have problems or questions, contact your health care provider. ?What can I expect after the procedure? ?After the procedure, it is common to have: ?Tiredness. ?Forgetfulness about what happened after the procedure. ?Impaired judgment for important decisions. ?Nausea or vomiting. ?Some difficulty with balance. ?Follow these instructions at home: ?For the time period you were told by your health care provider: ? ?  ? ?Rest as needed. ?Do not participate in activities where you could fall or become injured. ?Do not drive or use machinery. ?Do not drink alcohol. ?Do not take sleeping pills or medicines that cause drowsiness. ?Do not make important decisions or sign legal documents. ?Do not take care of children on your own. ?Eating and  drinking ?Follow the diet that is recommended by your health care provider. ?Drink enough fluid to keep your urine pale yellow. ?If you vomit: ?Drink water, juice, or soup when you can drink without vomiting. ?Make sure you have little or no nausea before eating solid foods. ?General instructions ?Have a responsible adult stay with you for the time you are told. It is important to have someone help care for you until you are awake and alert. ?Take over-the-counter and prescription medicines only as told by your health care provider. ?If you have sleep apnea, surgery and certain medicines can increase your risk for breathing problems. Follow instructions from your health care provider about wearing your sleep device: ?Anytime you are sleeping, including during daytime naps. ?While taking prescription pain medicines, sleeping medicines, or medicines that make you  drowsy. ?Avoid smoking. ?Keep all follow-up visits as told by your health care provider. This is important. ?Contact a health care provider if: ?You keep feeling nauseous or you keep vomiting. ?You feel light-headed. ?You are still sleepy or having trouble with balance after 24 hours. ?You develop a rash. ?You have a fever. ?You have redness or swelling around the IV site. ?Get help right away if: ?You have trouble breathing. ?You have new-onset confusion at home. ?Summary ?For several hours after your procedure, you may feel tired. You may also be forgetful and have poor judgment. ?Have a responsible adult stay with you for the time you are told. It is important to have someone help care for you until you are awake and alert. ?Rest as told. Do not drive or operate machinery. Do not drink alcohol or take sleeping pills. ?Get help right away if you have trouble breathing, or if you suddenly become confused. ?This information is not intended to replace advice given to you by your health care provider. Make sure you discuss any questions you have with your  health care provider. ?Document Revised: 10/03/2021 Document Reviewed: 10/01/2019 ?Elsevier Patient Education ? [place]. ? ?

## 2022-02-28 ENCOUNTER — Encounter (HOSPITAL_COMMUNITY)
Admission: RE | Admit: 2022-02-28 | Discharge: 2022-02-28 | Disposition: A | Discharge status: Home / Self Care | Payer: Medicare PPO | Source: Ambulatory Visit | Attending: Gastroenterology | Admitting: Gastroenterology

## 2022-02-28 ENCOUNTER — Encounter (HOSPITAL_COMMUNITY): Payer: Self-pay

## 2022-02-28 VITALS — BP 152/74 | HR 69 | Temp 98.6°F | Resp 18 | Ht 66.0 in | Wt 253.0 lb

## 2022-02-28 DIAGNOSIS — Z01818 Encounter for other preprocedural examination: Secondary | ICD-10-CM | POA: Diagnosis not present

## 2022-02-28 DIAGNOSIS — K8681 Exocrine pancreatic insufficiency: Secondary | ICD-10-CM | POA: Insufficient documentation

## 2022-02-28 DIAGNOSIS — R945 Abnormal results of liver function studies: Secondary | ICD-10-CM | POA: Insufficient documentation

## 2022-02-28 LAB — COMPREHENSIVE METABOLIC PANEL
ALT: 36 U/L (ref 0–44)
AST: 34 U/L (ref 15–41)
Albumin: 4 g/dL (ref 3.5–5.0)
Alkaline Phosphatase: 47 U/L (ref 38–126)
Anion gap: 7 (ref 5–15)
BUN: 17 mg/dL (ref 8–23)
CO2: 26 mmol/L (ref 22–32)
Calcium: 9.3 mg/dL (ref 8.9–10.3)
Chloride: 108 mmol/L (ref 98–111)
Creatinine, Ser: 0.63 mg/dL (ref 0.44–1.00)
GFR, Estimated: >60 mL/min (ref >60)
Glucose, Bld: 111 mg/dL — ABNORMAL HIGH (ref 70–99)
Potassium: 4.1 mmol/L (ref 3.5–5.1)
Sodium: 141 mmol/L (ref 135–145)
Total Bilirubin: 0.7 mg/dL (ref 0.3–1.2)
Total Protein: 7.3 g/dL (ref 6.5–8.1)

## 2022-02-28 LAB — HEPATITIS PANEL, ACUTE
HCV Ab: NONREACTIVE
Hep A IgM: NONREACTIVE
Hep B C IgM: NONREACTIVE
Hepatitis B Surface Ag: NONREACTIVE

## 2022-03-02 ENCOUNTER — Ambulatory Visit (HOSPITAL_BASED_OUTPATIENT_CLINIC_OR_DEPARTMENT_OTHER): Payer: Medicare PPO | Admitting: Anesthesiology

## 2022-03-02 ENCOUNTER — Encounter (HOSPITAL_COMMUNITY)
Admission: RE | Disposition: A | Discharge status: Home / Self Care | Payer: Self-pay | Source: Home / Self Care | Attending: Gastroenterology

## 2022-03-02 ENCOUNTER — Ambulatory Visit (HOSPITAL_COMMUNITY): Payer: Medicare PPO | Admitting: Anesthesiology

## 2022-03-02 ENCOUNTER — Encounter (HOSPITAL_COMMUNITY): Payer: Self-pay | Admitting: Gastroenterology

## 2022-03-02 ENCOUNTER — Ambulatory Visit (HOSPITAL_COMMUNITY)
Admission: RE | Admit: 2022-03-02 | Discharge: 2022-03-02 | Disposition: A | Discharge status: Home / Self Care | Payer: Medicare PPO | Attending: Gastroenterology | Admitting: Gastroenterology

## 2022-03-02 DIAGNOSIS — Z1211 Encounter for screening for malignant neoplasm of colon: Secondary | ICD-10-CM

## 2022-03-02 DIAGNOSIS — Z8673 Personal history of transient ischemic attack (TIA), and cerebral infarction without residual deficits: Secondary | ICD-10-CM | POA: Diagnosis not present

## 2022-03-02 DIAGNOSIS — K5730 Diverticulosis of large intestine without perforation or abscess without bleeding: Secondary | ICD-10-CM

## 2022-03-02 DIAGNOSIS — K648 Other hemorrhoids: Secondary | ICD-10-CM

## 2022-03-02 DIAGNOSIS — E7800 Pure hypercholesterolemia, unspecified: Secondary | ICD-10-CM | POA: Diagnosis not present

## 2022-03-02 DIAGNOSIS — I10 Essential (primary) hypertension: Secondary | ICD-10-CM | POA: Insufficient documentation

## 2022-03-02 DIAGNOSIS — D124 Benign neoplasm of descending colon: Secondary | ICD-10-CM | POA: Diagnosis not present

## 2022-03-02 DIAGNOSIS — K644 Residual hemorrhoidal skin tags: Secondary | ICD-10-CM | POA: Diagnosis not present

## 2022-03-02 DIAGNOSIS — D122 Benign neoplasm of ascending colon: Secondary | ICD-10-CM | POA: Insufficient documentation

## 2022-03-02 DIAGNOSIS — K635 Polyp of colon: Secondary | ICD-10-CM | POA: Diagnosis not present

## 2022-03-02 DIAGNOSIS — Z6841 Body Mass Index (BMI) 40.0 and over, adult: Secondary | ICD-10-CM | POA: Diagnosis not present

## 2022-03-02 DIAGNOSIS — G4733 Obstructive sleep apnea (adult) (pediatric): Secondary | ICD-10-CM | POA: Insufficient documentation

## 2022-03-02 DIAGNOSIS — Z01812 Encounter for preprocedural laboratory examination: Secondary | ICD-10-CM

## 2022-03-02 HISTORY — PX: COLONOSCOPY WITH PROPOFOL: SHX5780

## 2022-03-02 HISTORY — PX: POLYPECTOMY: SHX149

## 2022-03-02 LAB — HM COLONOSCOPY

## 2022-03-02 SURGERY — COLONOSCOPY WITH PROPOFOL
Anesthesia: General

## 2022-03-02 MED ORDER — PROPOFOL 500 MG/50ML IV EMUL
INTRAVENOUS | Status: DC | PRN
  Administered 2022-03-02: 150 ug/kg/min via INTRAVENOUS

## 2022-03-02 MED ORDER — SODIUM CHLORIDE 0.9 % IV SOLN: INTRAVENOUS | Status: DC

## 2022-03-02 MED ORDER — LACTATED RINGERS IV SOLN: INTRAVENOUS | Status: DC

## 2022-03-02 MED ORDER — STERILE WATER FOR IRRIGATION IR SOLN
Status: DC | PRN
  Administered 2022-03-02: 60 mL

## 2022-03-02 MED ORDER — PROPOFOL 10 MG/ML IV BOLUS
INTRAVENOUS | Status: DC | PRN
  Administered 2022-03-02: 80 mg via INTRAVENOUS

## 2022-03-02 NOTE — Transfer of Care (Signed)
Immediate Anesthesia Transfer of Care Note ? ?Patient: Heidi Rhodes ? ?Procedure(s) Performed: COLONOSCOPY WITH PROPOFOL ?POLYPECTOMY INTESTINAL ? ?Patient Location: PACU ? ?Anesthesia Type:General ? ?Level of Consciousness: awake, alert  and oriented ? ?Airway & Oxygen Therapy: Patient Spontanous Breathing ? ?Post-op Assessment: Report given to RN, Post -op Vital signs reviewed and stable, Patient moving all extremities X 4 and Patient able to stick tongue midline ? ?Post vital signs: Reviewed ? ?Last Vitals:  ?Vitals Value Taken Time  ?BP 144/63   ?Temp 97.4   ?Pulse 86   ?Resp 18   ?SpO2 97   ? ? ?Last Pain:  ?Vitals:  ? 03/02/22 0800  ?PainSc: 0-No pain  ?   ? ?  ? ?Complications: No notable events documented. ?

## 2022-03-02 NOTE — Discharge Instructions (Addendum)
You are being discharged to home.  ?Resume your previous diet.  ?We are waiting for your pathology results.  ?Your physician has recommended a repeat colonoscopy (date to be determined after pending pathology results are reviewed) for surveillance.  ?Restart aspirin tonight. ?

## 2022-03-02 NOTE — H&P (Signed)
Heidi Rhodes is an 71 y.o. female.   ?Chief Complaint: screening colonoscopy ?HPI: Heidi Rhodes is a 71 y.o. female that medical history of anxiety, hyperlipidemia, hypertension, stroke, OSA, and exocrine pancreatic insufficiency who presents for screening colonoscopy. ? ?Denies complaints. The patient denies having any nausea, vomiting, fever, chills, hematochezia, melena, hematemesis, abdominal distention, abdominal pain, diarrhea, jaundice, pruritus or weight loss. ? ?No family history of colon cancer. ? ?Last Colonoscopy:02/13/12 mild sigmoid diverticulosis, small external hemorrhoids and anal papillae ? ?Past Medical History:  ?Diagnosis Date  ? Anxiety   ? about being put to sleep for  surgery  ? Arthritis   ? Gallbladder disorder   ? High cholesterol   ? HTN (hypertension)   ? Sleep apnea   ? Stroke [facility])   ? ? ?Past Surgical History:  ?Procedure Laterality Date  ? APPENDECTOMY    ? CATARACT EXTRACTION W/PHACO Left 06/02/2018  ? Procedure: CATARACT EXTRACTION PHACO AND INTRAOCULAR LENS PLACEMENT LEFT EYE;  Surgeon: Tonny Branch, MD;  Location: [facility];  Service: Ophthalmology;  Laterality: Left;  CDE: 8.40  ? CATARACT EXTRACTION W/PHACO Right 06/23/2018  ? Procedure: CATARACT EXTRACTION PHACO AND INTRAOCULAR LENS PLACEMENT (IOC);  Surgeon: Tonny Branch, MD;  Location: [facility];  Service: Ophthalmology;  Laterality: Right;  CDE: 9.94  ? ceasarean section    ? CHOLECYSTECTOMY  06/16/2012  ? Procedure: LAPAROSCOPIC CHOLECYSTECTOMY;  Surgeon: Scherry Ran, MD;  Location: [facility];  Service: General;  Laterality: N/A;  ? COLONOSCOPY  02/13/2012  ? Procedure: COLONOSCOPY;  Surgeon: Rogene Houston, MD;  Location: [facility];  Service: Endoscopy;  Laterality: N/A;  930 possibly earlier for antibiotics  ? Left knee replacement    ? 2012  ? TOTAL KNEE ARTHROPLASTY  04/18/2012  ? Procedure: TOTAL KNEE ARTHROPLASTY;  Surgeon: Yvette Rack., MD;  Location: [facility];  Service: Orthopedics;  Laterality: Right;  with revison  tibia  ? ? ?Family History  ?Problem Relation Age of Onset  ? Heart attack Father   ? Other Brother   ?     car accident  ? Anesthesia problems Neg Hx   ? ?Social History:  reports that she has never smoked. She has never used smokeless tobacco. She reports that she does not drink alcohol and does not use drugs. ? ?Allergies: No Known Allergies ? ?Medications Prior to Admission  ?Medication Sig Dispense Refill  ? amLODipine (NORVASC) 5 MG tablet Take 5 mg by mouth daily.    ? Apoaequorin (PREVAGEN) 10 MG CAPS Take 10 mg by mouth daily.    ? Ascorbic Acid (VITAMIN C) 1000 MG tablet Take 1,000 mg by mouth daily.    ? Biotin 1000 MCG tablet Take 1,000 mcg by mouth daily.    ? carboxymethylcellulose (REFRESH PLUS) 0.5 % SOLN Place 1 drop into both eyes 2 (two) times daily.    ? hydrochlorothiazide (HYDRODIURIL) 25 MG tablet Take 25 mg by mouth daily.    ? levOCARNitine (L-CARNITINE PO) Take 1,000 mg by mouth daily.    ? lisinopril (PRINIVIL,ZESTRIL) 10 MG tablet Take 10 mg by mouth daily.    ? Pancrelipase, Lip-Prot-Amyl, (CREON) 24000-76000 units CPEP Take 1 capsule by mouth 3 (three) times daily before meals.    ? Probiotic Product (PROBIOTIC-10 PO) Take 1 capsule by mouth daily.    ? rosuvastatin (CRESTOR) 5 MG tablet Take 5 mg by mouth daily.    ? aspirin 325 MG tablet Take 325 mg by mouth daily.    ?  Calcium Carbonate-Vitamin D (CALCIUM-D PO) Take 1 tablet by mouth daily.    ? Multiple Vitamin (MULTIVITAMIN) tablet Take 1 tablet by mouth daily.    ? naproxen sodium (ALEVE) 220 MG tablet Take 220 mg by mouth daily as needed (pain).    ? ? ?Results for orders placed or performed during the hospital encounter of 02/28/22 (from the past 48 hour(s))  ?Comprehensive metabolic panel     Status: Abnormal  ? Collection Time: 02/28/22 11:00 AM  ?Result Value Ref Range  ? Sodium 141 135 - 145 mmol/L  ? Potassium 4.1 3.5 - 5.1 mmol/L  ? Chloride 108 98 - 111 mmol/L  ? CO2 26 22 - 32 mmol/L  ? Glucose, Bld 111 (H) 70 - 99 mg/dL   ?  Comment: Glucose reference range applies only to samples taken after fasting for at least 8 hours.  ? BUN 17 8 - 23 mg/dL  ? Creatinine, Ser 0.63 0.44 - 1.00 mg/dL  ? Calcium 9.3 8.9 - 10.3 mg/dL  ? Total Protein 7.3 6.5 - 8.1 g/dL  ? Albumin 4.0 3.5 - 5.0 g/dL  ? AST 34 15 - 41 U/L  ? ALT 36 0 - 44 U/L  ? Alkaline Phosphatase 47 38 - 126 U/L  ? Total Bilirubin 0.7 0.3 - 1.2 mg/dL  ? GFR, Estimated >60 >60 mL/min  ?  Comment: (NOTE) ?Calculated using the CKD-EPI Creatinine Equation (2021) ?  ? Anion gap 7 5 - 15  ?  Comment: Performed at [facility], [address], [place], [place] [address]  ?Hepatitis panel, acute     Status: None  ? Collection Time: 02/28/22 11:00 AM  ?Result Value Ref Range  ? Hepatitis B Surface Ag NON REACTIVE NON REACTIVE  ? HCV Ab NON REACTIVE NON REACTIVE  ?  Comment: (NOTE) ?Nonreactive HCV antibody screen is consistent with no HCV infections,  ?unless recent infection is suspected or other evidence exists to ?indicate HCV infection. ? ?  ? Hep A IgM NON REACTIVE NON REACTIVE  ? Hep B C IgM NON REACTIVE NON REACTIVE  ?  Comment: Performed at [facility], [place] [address], [place], [place] [address]  ? ?No results found. ? ?Review of Systems  ?Constitutional: Negative.   ?HENT: Negative.    ?Eyes: Negative.   ?Respiratory: Negative.    ?Cardiovascular: Negative.   ?Gastrointestinal: Negative.   ?Endocrine: Negative.   ?Genitourinary: Negative.   ?Musculoskeletal: Negative.   ?Allergic/Immunologic: Negative.   ?Neurological: Negative.   ?Hematological: Negative.   ?Psychiatric/Behavioral: Negative.    ? ?Blood pressure (!) 149/58, pulse 71, temperature 97.7 ?F (36.5 ?C), resp. rate 19, SpO2 95 %. ?Physical Exam  ?GENERAL: The patient is AO x3, in no acute distress. ?HEENT: Head is normocephalic and atraumatic. EOMI are intact. Mouth is well hydrated and without lesions. ?NECK: Supple. No masses ?LUNGS: Clear to auscultation. No presence of rhonchi/wheezing/rales. Adequate chest  expansion ?HEART: RRR, normal s1 and s2. ?ABDOMEN: Soft, nontender, no guarding, no peritoneal signs, and nondistended. BS +. No masses. ?EXTREMITIES: Without any cyanosis, clubbing, rash, lesions or edema. ?NEUROLOGIC: AOx3, no focal motor deficit. ?SKIN: no jaundice, no rashes ? ?Assessment/Plan ? Heidi Rhodes is a 71 y.o. female that medical history of anxiety, hyperlipidemia, hypertension, stroke, OSA, and exocrine pancreatic insufficiency who presents for screening colonoscopy. Will proceed with colonoscopy ? ?Harvel Quale, MD ?03/02/2022, 8:32 AM ? ? ? ?

## 2022-03-02 NOTE — Op Note (Signed)
[facility] ?Patient Name: Heidi Rhodes ?Procedure Date: 03/02/2022 8:44 AM ?MRN: [id number] ?Date of Birth: 07-27-1951 ?Attending MD: Maylon Peppers ,  ?CSN: [id number] ?Age: 71 ?Admit Type: Outpatient ?Procedure:                Colonoscopy ?Indications:              Screening for colorectal malignant neoplasm ?Providers:                Maylon Peppers, Crystal Page, Raphael Gibney,  ?                          Technician ?Referring MD:              ?Medicines:                Monitored Anesthesia Care ?Complications:            No immediate complications. ?Estimated Blood Loss:     Estimated blood loss: none. ?Procedure:                Pre-Anesthesia Assessment: ?                          - Prior to the procedure, a History and Physical  ?                          was performed, and patient medications, allergies  ?                          and sensitivities were reviewed. The patient's  ?                          tolerance of previous anesthesia was reviewed. ?                          - The risks and benefits of the procedure and the  ?                          sedation options and risks were discussed with the  ?                          patient. All questions were answered and informed  ?                          consent was obtained. ?                          - ASA Grade Assessment: II - A patient with mild  ?                          systemic disease. ?                          After obtaining informed consent, the colonoscope  ?                          was passed under direct vision. Throughout the  ?  procedure, the patient's blood pressure, pulse, and  ?                          oxygen saturations were monitored continuously. The  ?                          PCF-HQ190L ([id number]) scope was introduced through  ?                          the anus and advanced to the the cecum, identified  ?                          by appendiceal orifice and ileocecal valve. The  ?                           colonoscopy was performed without difficulty. The  ?                          patient tolerated the procedure well. The quality  ?                          of the bowel preparation was good. ?Scope In: 8:55:49 AM ?Scope Out: 9:22:45 AM ?Scope Withdrawal Time: 0 hours 21 minutes 29 seconds  ?Total Procedure Duration: 0 hours 26 minutes 56 seconds  ?Findings: ?     Hemorrhoids were found on perianal exam. ?     Two sessile polyps were found in the descending colon and ascending  ?     colon. The polyps were 3 to 4 mm in size. These polyps were removed with  ?     a cold snare. Resection and retrieval were complete. ?     A few small-mouthed diverticula were found in the sigmoid colon. ?     Non-bleeding external and internal hemorrhoids were found during  ?     retroflexion. ?Impression:               - Hemorrhoids found on perianal exam. ?                          - Two 3 to 4 mm polyps in the descending colon and  ?                          in the ascending colon, removed with a cold snare.  ?                          Resected and retrieved. ?                          - Diverticulosis in the sigmoid colon. ?                          - Non-bleeding external and internal hemorrhoids. ?Moderate Sedation: ?     Per Anesthesia Care ?Recommendation:           - Discharge patient to home (ambulatory). ?                          -  Resume previous diet. ?                          - Await pathology results. ?                          - Repeat colonoscopy date to be determined after  ?                          pending pathology results are reviewed for  ?                          surveillance. ?                          - Restart aspirin tonight. ?Procedure Code(s):        --- Professional --- ?                          [phone], Colonoscopy, flexible; with removal of  ?                          tumor(s), polyp(s), or other lesion(s) by snare  ?                          technique ?Diagnosis Code(s):        ---  Professional --- ?                          Z12.11, Encounter for screening for malignant  ?                          neoplasm of colon ?                          K64.8, Other hemorrhoids ?                          K63.5, Polyp of colon ?                          K57.30, Diverticulosis of large intestine without  ?                          perforation or abscess without bleeding ?CPT copyright 2019 American Medical Association. All rights reserved. ?The codes documented in this report are preliminary and upon coder review may  ?be revised to meet current compliance requirements. ?Maylon Peppers, MD ?Maylon Peppers,  ?03/02/2022 9:27:07 AM ?This report has been signed electronically. ?Number of Addenda: 0 ?

## 2022-03-02 NOTE — Anesthesia Preprocedure Evaluation (Signed)
Anesthesia Evaluation  ?Patient identified by MRN, date of birth, ID band ?Patient awake ? ? ? ?Reviewed: ?Allergy & Precautions, H&P , NPO status , Patient's Chart, lab work & pertinent test results, reviewed documented beta blocker date and time  ? ?Airway ?Mallampati: II ? ?TM Distance: >3 FB ?Neck ROM: full ? ? ? Dental ?no notable dental hx. ? ?  ?Pulmonary ?sleep apnea ,  ?  ?Pulmonary exam normal ?breath sounds clear to auscultation ? ? ? ? ? ? Cardiovascular ?Exercise Tolerance: Good ?hypertension, negative cardio ROS ? ? ?Rhythm:regular Rate:Normal ? ? ?  ?Neuro/Psych ?PSYCHIATRIC DISORDERS Anxiety CVA   ? GI/Hepatic ?negative GI ROS, Neg liver ROS,   ?Endo/Other  ?Morbid obesity ? Renal/GU ?negative Renal ROS  ?negative genitourinary ?  ?Musculoskeletal ? ? Abdominal ?  ?Peds ? Hematology ?negative hematology ROS ?(+)   ?Anesthesia Other Findings ? ? Reproductive/Obstetrics ?negative OB ROS ? ?  ? ? ? ? ? ? ? ? ? ? ? ? ? ?  ?  ? ? ? ? ? ? ? ? ?Anesthesia Physical ?Anesthesia Plan ? ?ASA: 3 ? ?Anesthesia Plan: General  ? ?Post-op Pain Management:   ? ?Induction:  ? ?PONV Risk Score and Plan: Propofol infusion ? ?Airway Management Planned:  ? ?Additional Equipment:  ? ?Intra-op Plan:  ? ?Post-operative Plan:  ? ?Informed Consent: I have reviewed the patients History and Physical, chart, labs and discussed the procedure including the risks, benefits and alternatives for the proposed anesthesia with the patient or authorized representative who has indicated his/her understanding and acceptance.  ? ? ? ?Dental Advisory Given ? ?Plan Discussed with: CRNA ? ?Anesthesia Plan Comments:   ? ? ? ? ? ? ?Anesthesia Quick Evaluation ? ?

## 2022-03-02 NOTE — Anesthesia Postprocedure Evaluation (Signed)
Anesthesia Post Note ? ?Patient: Heidi Rhodes ? ?Procedure(s) Performed: COLONOSCOPY WITH PROPOFOL ?POLYPECTOMY INTESTINAL ? ?Patient location during evaluation: Phase II ?Anesthesia Type: General ?Level of consciousness: awake ?Pain management: pain level controlled ?Vital Signs Assessment: post-procedure vital signs reviewed and stable ?Respiratory status: spontaneous breathing and respiratory function stable ?Cardiovascular status: blood pressure returned to baseline and stable ?Postop Assessment: no headache and no apparent nausea or vomiting ?Anesthetic complications: no ?Comments: Late entry ? ? ?No notable events documented. ? ? ?Last Vitals:  ?Vitals:  ? 03/02/22 0800 03/02/22 0926  ?BP: (!) 149/58 (!) 144/63  ?Pulse: 71   ?Resp: 19 18  ?Temp: 36.5 ?C 36.5 ?C  ?SpO2: 95% 97%  ?  ?Last Pain:  ?Vitals:  ? 03/02/22 0926  ?TempSrc: Oral  ?PainSc: 0-No pain  ? ? ?  ?  ?  ?  ?  ?  ? ?Louann Sjogren ? ? ? ? ?

## 2022-03-05 ENCOUNTER — Encounter (INDEPENDENT_AMBULATORY_CARE_PROVIDER_SITE_OTHER): Payer: Self-pay | Admitting: *Deleted

## 2022-03-05 LAB — SURGICAL PATHOLOGY

## 2022-03-05 NOTE — Progress Notes (Signed)
Lab order for evaluation of elevated LFTs, drawn in pre op by [facility] staff ?

## 2022-03-06 ENCOUNTER — Ambulatory Visit (HOSPITAL_COMMUNITY)
Admission: RE | Admit: 2022-03-06 | Discharge: 2022-03-06 | Disposition: A | Discharge status: Home / Self Care | Payer: Medicare PPO | Source: Ambulatory Visit | Attending: Gastroenterology | Admitting: Gastroenterology

## 2022-03-06 ENCOUNTER — Encounter (HOSPITAL_COMMUNITY): Payer: Self-pay | Admitting: Gastroenterology

## 2022-03-06 DIAGNOSIS — K8681 Exocrine pancreatic insufficiency: Secondary | ICD-10-CM | POA: Diagnosis not present

## 2022-03-06 DIAGNOSIS — I700 Atherosclerosis of aorta: Secondary | ICD-10-CM | POA: Diagnosis not present

## 2022-03-06 DIAGNOSIS — Z9049 Acquired absence of other specified parts of digestive tract: Secondary | ICD-10-CM | POA: Diagnosis not present

## 2022-03-06 DIAGNOSIS — R7401 Elevation of levels of liver transaminase levels: Secondary | ICD-10-CM | POA: Insufficient documentation

## 2022-03-06 DIAGNOSIS — R197 Diarrhea, unspecified: Secondary | ICD-10-CM | POA: Diagnosis not present

## 2022-03-06 DIAGNOSIS — K8689 Other specified diseases of pancreas: Secondary | ICD-10-CM | POA: Diagnosis not present

## 2022-03-06 MED ORDER — IOHEXOL 300 MG/ML  SOLN
100.0000 mL | Freq: Once | INTRAMUSCULAR | Status: AC | PRN
  Administered 2022-03-06: 100 mL via INTRAVENOUS

## 2022-03-08 DIAGNOSIS — H26492 Other secondary cataract, left eye: Secondary | ICD-10-CM | POA: Diagnosis not present

## 2022-03-08 DIAGNOSIS — H02834 Dermatochalasis of left upper eyelid: Secondary | ICD-10-CM | POA: Diagnosis not present

## 2022-03-08 DIAGNOSIS — H02831 Dermatochalasis of right upper eyelid: Secondary | ICD-10-CM | POA: Diagnosis not present

## 2022-03-08 DIAGNOSIS — H01001 Unspecified blepharitis right upper eyelid: Secondary | ICD-10-CM | POA: Diagnosis not present

## 2022-03-13 DIAGNOSIS — R7301 Impaired fasting glucose: Secondary | ICD-10-CM | POA: Diagnosis not present

## 2022-03-13 DIAGNOSIS — E782 Mixed hyperlipidemia: Secondary | ICD-10-CM | POA: Diagnosis not present

## 2022-03-13 DIAGNOSIS — R946 Abnormal results of thyroid function studies: Secondary | ICD-10-CM | POA: Diagnosis not present

## 2022-03-21 ENCOUNTER — Telehealth (INDEPENDENT_AMBULATORY_CARE_PROVIDER_SITE_OTHER): Payer: Self-pay | Admitting: Gastroenterology

## 2022-03-21 DIAGNOSIS — I10 Essential (primary) hypertension: Secondary | ICD-10-CM | POA: Diagnosis not present

## 2022-03-21 DIAGNOSIS — R946 Abnormal results of thyroid function studies: Secondary | ICD-10-CM | POA: Diagnosis not present

## 2022-03-21 DIAGNOSIS — Z6841 Body Mass Index (BMI) 40.0 and over, adult: Secondary | ICD-10-CM | POA: Diagnosis not present

## 2022-03-21 DIAGNOSIS — M25512 Pain in left shoulder: Secondary | ICD-10-CM | POA: Diagnosis not present

## 2022-03-21 DIAGNOSIS — G4733 Obstructive sleep apnea (adult) (pediatric): Secondary | ICD-10-CM | POA: Diagnosis not present

## 2022-03-21 DIAGNOSIS — E782 Mixed hyperlipidemia: Secondary | ICD-10-CM | POA: Diagnosis not present

## 2022-03-21 DIAGNOSIS — R7301 Impaired fasting glucose: Secondary | ICD-10-CM | POA: Diagnosis not present

## 2022-03-21 DIAGNOSIS — K580 Irritable bowel syndrome with diarrhea: Secondary | ICD-10-CM | POA: Diagnosis not present

## 2022-03-21 DIAGNOSIS — Z8673 Personal history of transient ischemic attack (TIA), and cerebral infarction without residual deficits: Secondary | ICD-10-CM | POA: Diagnosis not present

## 2022-03-21 NOTE — Telephone Encounter (Signed)
I received the results of the most recent blood work-up performed on 03/13/2022 which showed a CMP with an AST of 23, ALT 29, total bilirubin 0.6, alkaline phosphatase 59, normal electrolytes and renal function, CBC with normal white blood cell count of 7.5, hemoglobin 15.2 and platelets of 210. ?Patient has presented improvement in her LFTs have significantly dietary changes.  She should continue this. ?

## 2022-03-23 ENCOUNTER — Other Ambulatory Visit (HOSPITAL_COMMUNITY): Payer: Self-pay | Admitting: Internal Medicine

## 2022-03-23 DIAGNOSIS — Z1231 Encounter for screening mammogram for malignant neoplasm of breast: Secondary | ICD-10-CM

## 2022-03-28 ENCOUNTER — Ambulatory Visit (HOSPITAL_COMMUNITY)
Admission: RE | Admit: 2022-03-28 | Discharge: 2022-03-28 | Disposition: A | Discharge status: Home / Self Care | Payer: Medicare PPO | Source: Ambulatory Visit | Attending: Internal Medicine | Admitting: Internal Medicine

## 2022-03-28 DIAGNOSIS — Z1231 Encounter for screening mammogram for malignant neoplasm of breast: Secondary | ICD-10-CM | POA: Insufficient documentation

## 2022-04-30 IMAGING — CT CT ABDOMEN WO/W CM
3 of 14 series · 11 of 46 positions shown, 17 images · IV contrast (Omnipaque or Isovue)
Comparison: None.

CLINICAL DATA: Diarrhea, exocrine pancreatic insufficiency

EXAM:
CT ABDOMEN WITHOUT AND WITH CONTRAST
TECHNIQUE: Multidetector CT imaging of the abdomen was performed following the
standard protocol before and following the bolus administration of
intravenous contrast.

[Series 5: arterial thins · axial · arterial · 0.88mm/px · z∈[+1334,+1436]mm · 3 of 308 slices shown]
[im 52/308  soft-tissue]
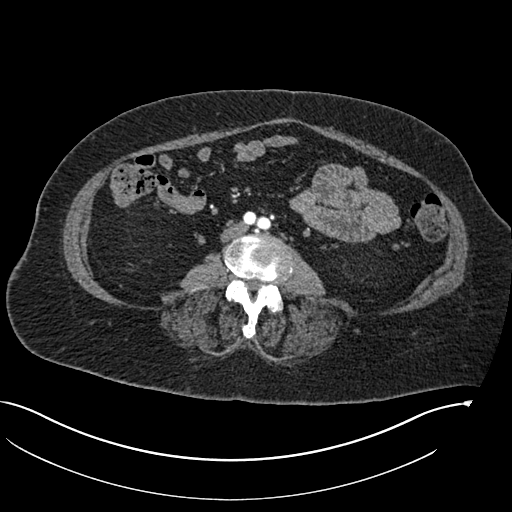
[im 103/308  soft-tissue]
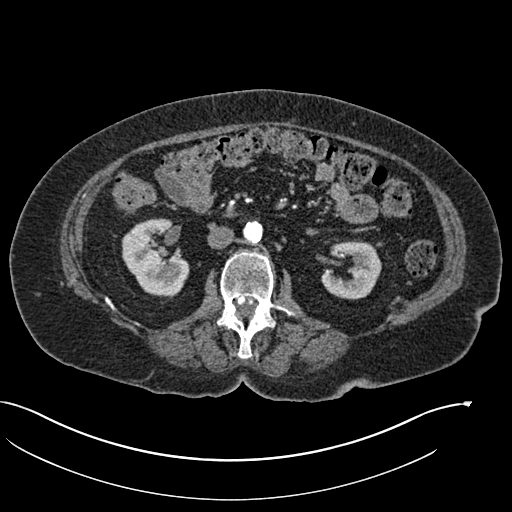
[im 154/308  soft-tissue]
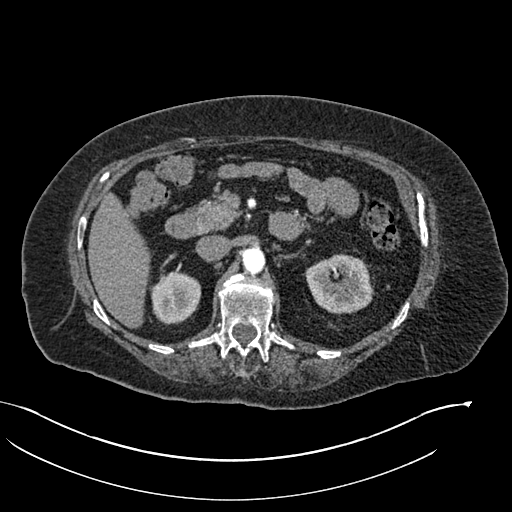

[Series 10: 2 venous thins · axial · portal-venous · 0.88mm/px · z∈[+1334,+1538]mm · 5 of 308 slices shown, 10 images]
[im 52/308  soft-tissue]
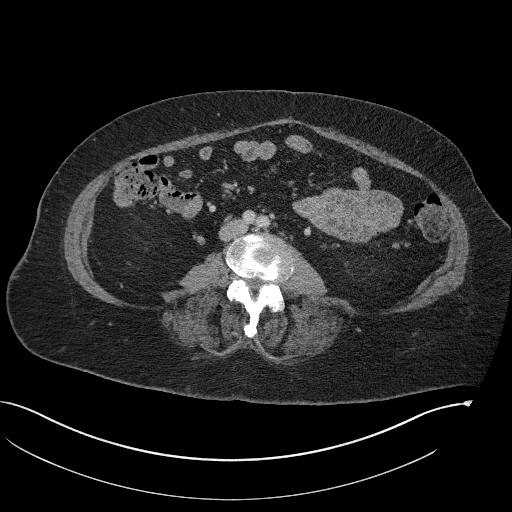
[im 52/308  bone]
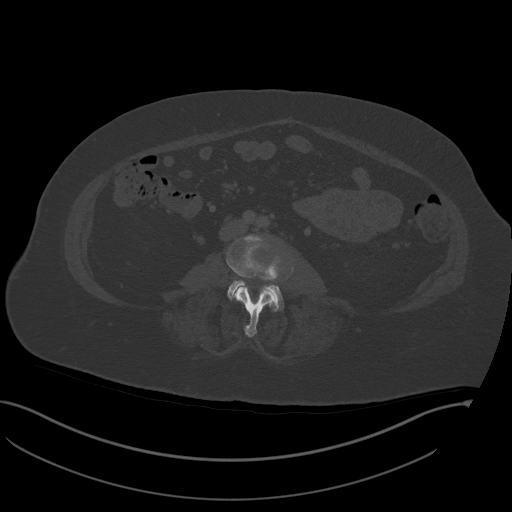
[im 103/308  soft-tissue]
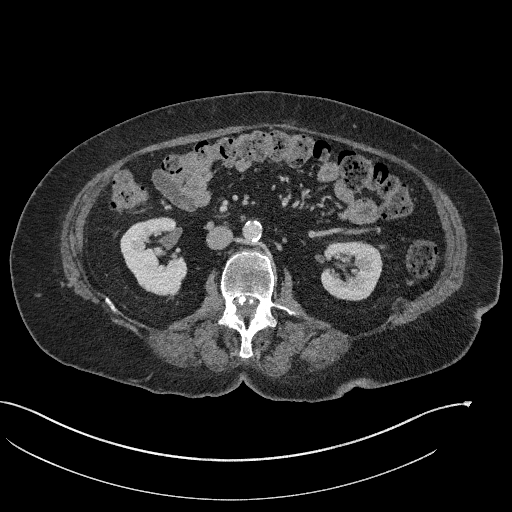
[im 103/308  lung]
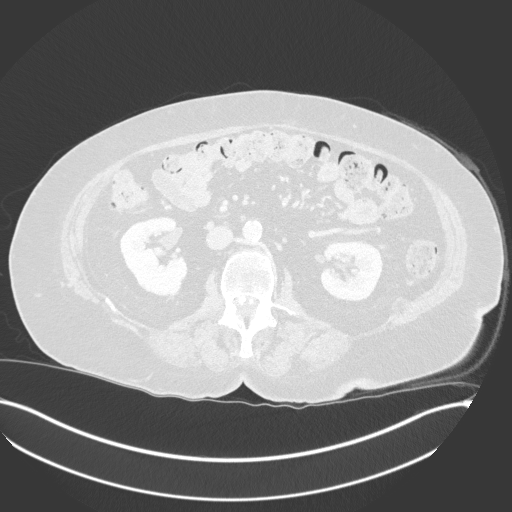
[im 154/308  soft-tissue]
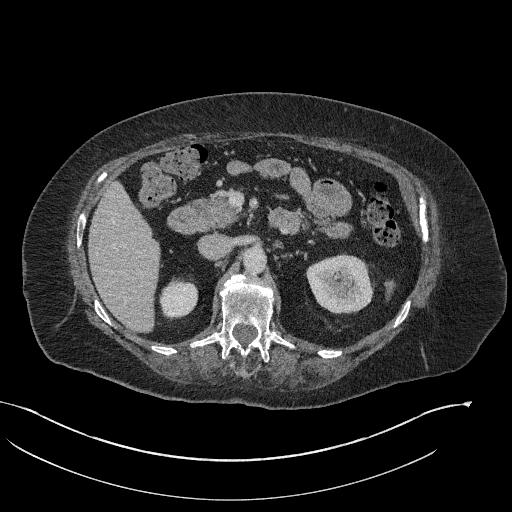
[im 154/308  lung]
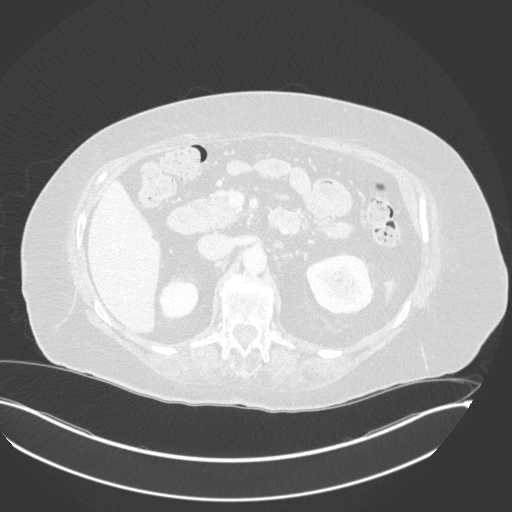
[im 205/308  soft-tissue]
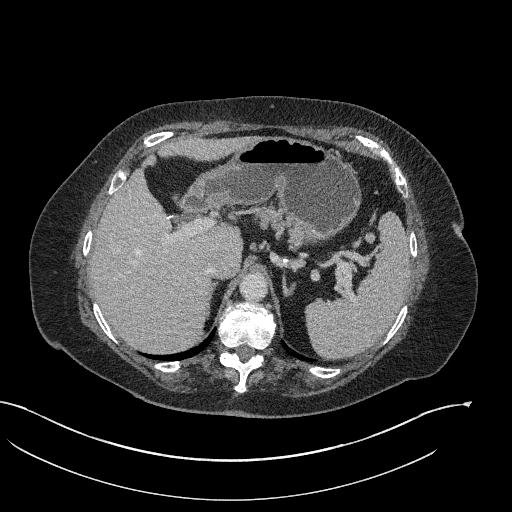
[im 205/308  lung]
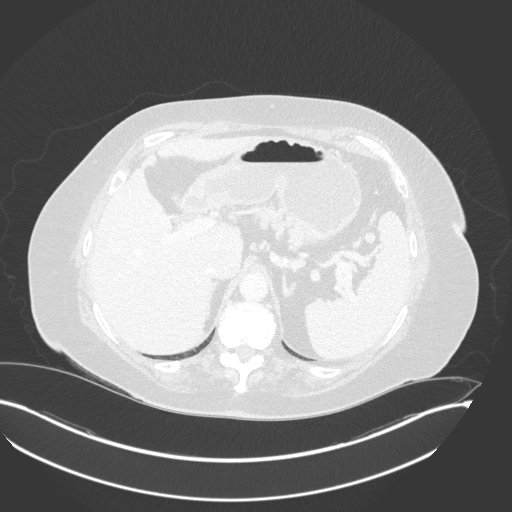
[im 256/308  soft-tissue]
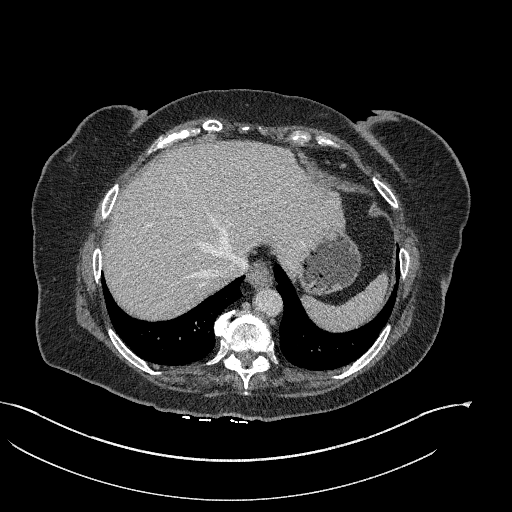
[im 256/308  lung]
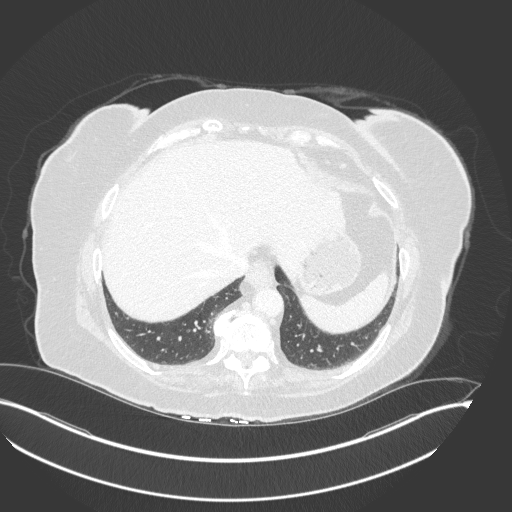

[Series 13: coronal venous · coronal · portal-venous · 0.60mm/px · 3 of 101 slices shown, 4 images]
[im 26/101  soft-tissue]
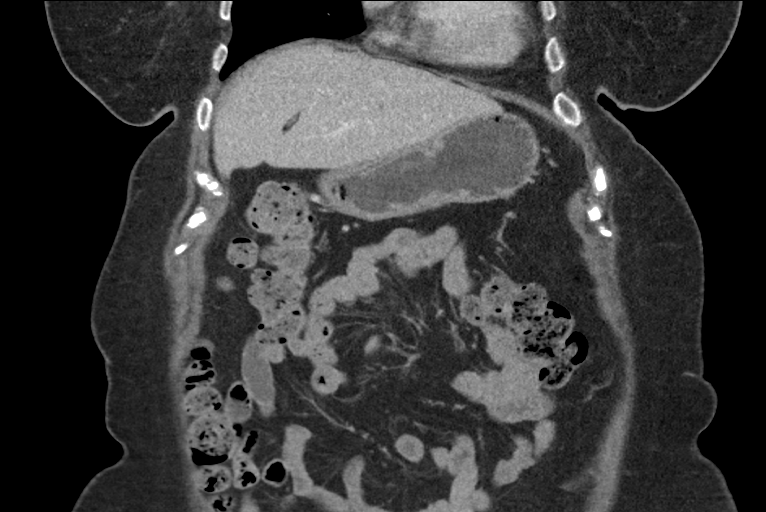
[im 51/101  soft-tissue]
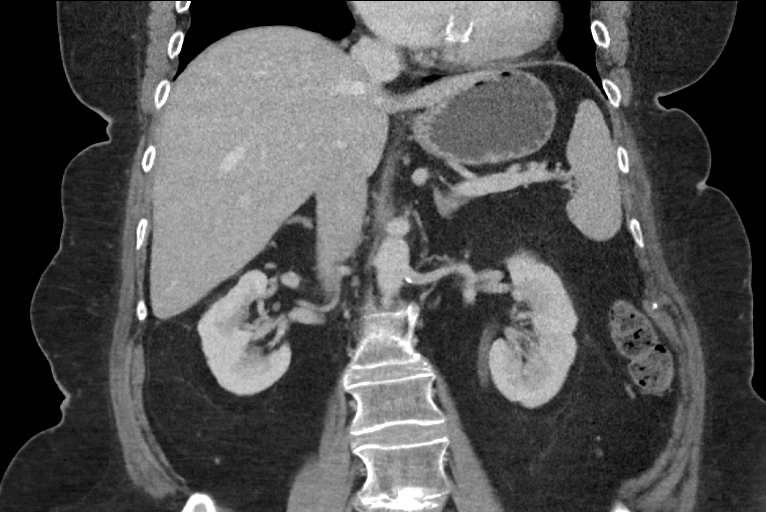
[im 51/101  bone]
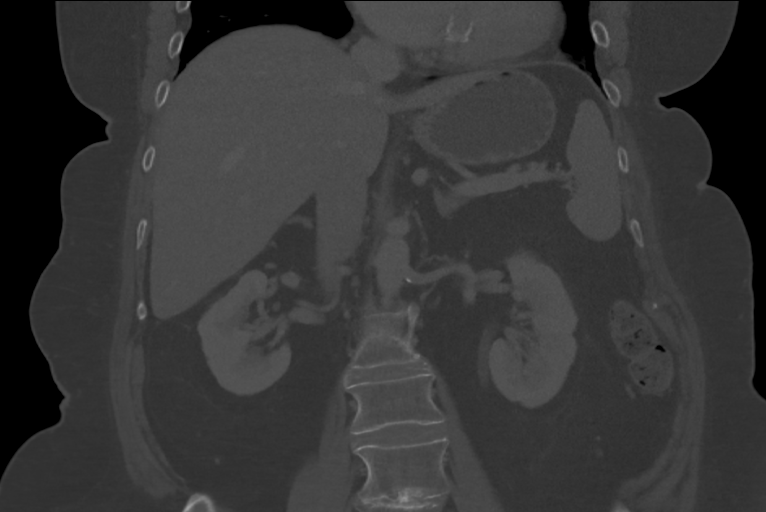
[im 76/101  soft-tissue]
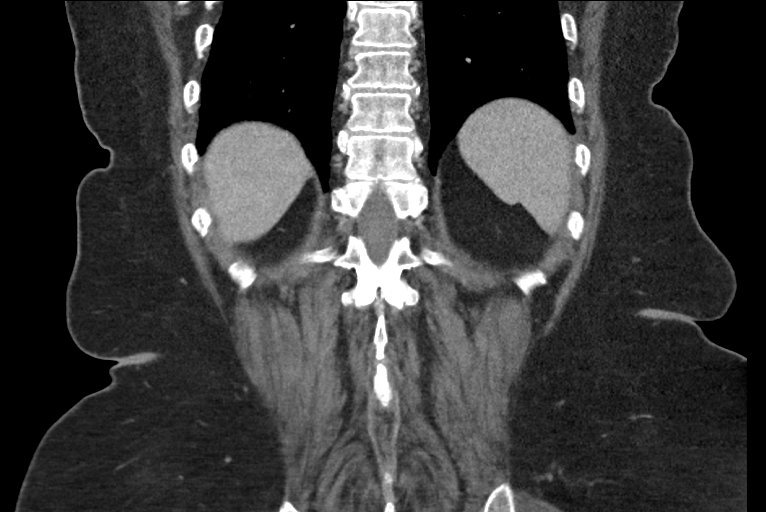

[11 of 46 positions shown; findings below may reference images not displayed]

RADIATION DOSE REDUCTION: This exam was performed according to the
departmental dose-optimization program which includes automated
exposure control, adjustment of the mA and/or kV according to
patient size and/or use of iterative reconstruction technique.

CONTRAST:  100mL OMNIPAQUE IOHEXOL 300 MG/ML  SOLN
FINDINGS: Lower chest: No acute abnormality.

Hepatobiliary: No focal liver abnormality is seen. Status post
cholecystectomy. No biliary dilatation.

Pancreas: Unremarkable. No pancreatic ductal dilatation or
surrounding inflammatory changes.

Spleen: Normal in size without significant abnormality.

Adrenals/Urinary Tract: Adrenal glands are unremarkable. Kidneys are
normal, without renal calculi, solid lesion, or hydronephrosis.

Stomach/Bowel: Stomach is within normal limits. No evidence of bowel
wall thickening, distention, or inflammatory changes.

Vascular/Lymphatic: Aortic atherosclerosis. No enlarged abdominal
lymph nodes.

Other: No abdominal wall hernia or abnormality. No ascites.

Musculoskeletal: No acute or significant osseous findings.
IMPRESSION: 1. No CT abnormality of the pancreas. No mass or suspicious contrast
enhancement.
2. Status post cholecystectomy.

Aortic Atherosclerosis ([id number]).

## 2022-05-09 ENCOUNTER — Encounter (INDEPENDENT_AMBULATORY_CARE_PROVIDER_SITE_OTHER): Payer: Self-pay | Admitting: Gastroenterology

## 2022-06-06 DIAGNOSIS — G4733 Obstructive sleep apnea (adult) (pediatric): Secondary | ICD-10-CM | POA: Diagnosis not present

## 2022-06-06 DIAGNOSIS — E785 Hyperlipidemia, unspecified: Secondary | ICD-10-CM | POA: Diagnosis not present

## 2022-06-06 DIAGNOSIS — I10 Essential (primary) hypertension: Secondary | ICD-10-CM | POA: Diagnosis not present

## 2022-06-06 DIAGNOSIS — M1990 Unspecified osteoarthritis, unspecified site: Secondary | ICD-10-CM | POA: Diagnosis not present

## 2022-06-06 DIAGNOSIS — R609 Edema, unspecified: Secondary | ICD-10-CM | POA: Diagnosis not present

## 2022-06-06 DIAGNOSIS — Z6839 Body mass index (BMI) 39.0-39.9, adult: Secondary | ICD-10-CM | POA: Diagnosis not present

## 2022-06-06 DIAGNOSIS — R32 Unspecified urinary incontinence: Secondary | ICD-10-CM | POA: Diagnosis not present

## 2022-06-06 DIAGNOSIS — K8681 Exocrine pancreatic insufficiency: Secondary | ICD-10-CM | POA: Diagnosis not present

## 2022-07-30 ENCOUNTER — Ambulatory Visit (INDEPENDENT_AMBULATORY_CARE_PROVIDER_SITE_OTHER): Payer: Medicare PPO | Admitting: Gastroenterology

## 2022-08-14 ENCOUNTER — Ambulatory Visit (INDEPENDENT_AMBULATORY_CARE_PROVIDER_SITE_OTHER): Payer: Medicare PPO | Admitting: Gastroenterology

## 2022-10-08 DIAGNOSIS — E782 Mixed hyperlipidemia: Secondary | ICD-10-CM | POA: Diagnosis not present

## 2022-10-08 DIAGNOSIS — R946 Abnormal results of thyroid function studies: Secondary | ICD-10-CM | POA: Diagnosis not present

## 2022-10-08 DIAGNOSIS — R7301 Impaired fasting glucose: Secondary | ICD-10-CM | POA: Diagnosis not present

## 2022-10-10 DIAGNOSIS — M25512 Pain in left shoulder: Secondary | ICD-10-CM | POA: Diagnosis not present

## 2022-10-10 DIAGNOSIS — Z6841 Body Mass Index (BMI) 40.0 and over, adult: Secondary | ICD-10-CM | POA: Diagnosis not present

## 2022-10-10 DIAGNOSIS — E782 Mixed hyperlipidemia: Secondary | ICD-10-CM | POA: Diagnosis not present

## 2022-10-10 DIAGNOSIS — R946 Abnormal results of thyroid function studies: Secondary | ICD-10-CM | POA: Diagnosis not present

## 2022-10-10 DIAGNOSIS — G4733 Obstructive sleep apnea (adult) (pediatric): Secondary | ICD-10-CM | POA: Diagnosis not present

## 2022-10-10 DIAGNOSIS — K580 Irritable bowel syndrome with diarrhea: Secondary | ICD-10-CM | POA: Diagnosis not present

## 2022-10-10 DIAGNOSIS — R7301 Impaired fasting glucose: Secondary | ICD-10-CM | POA: Diagnosis not present

## 2022-10-10 DIAGNOSIS — I10 Essential (primary) hypertension: Secondary | ICD-10-CM | POA: Diagnosis not present

## 2022-10-10 DIAGNOSIS — Z8673 Personal history of transient ischemic attack (TIA), and cerebral infarction without residual deficits: Secondary | ICD-10-CM | POA: Diagnosis not present

## 2023-04-05 DIAGNOSIS — R946 Abnormal results of thyroid function studies: Secondary | ICD-10-CM | POA: Diagnosis not present

## 2023-04-05 DIAGNOSIS — E782 Mixed hyperlipidemia: Secondary | ICD-10-CM | POA: Diagnosis not present

## 2023-04-05 DIAGNOSIS — R7301 Impaired fasting glucose: Secondary | ICD-10-CM | POA: Diagnosis not present

## 2023-04-09 DIAGNOSIS — Z0000 Encounter for general adult medical examination without abnormal findings: Secondary | ICD-10-CM | POA: Diagnosis not present

## 2023-04-10 DIAGNOSIS — K580 Irritable bowel syndrome with diarrhea: Secondary | ICD-10-CM | POA: Diagnosis not present

## 2023-04-10 DIAGNOSIS — G4733 Obstructive sleep apnea (adult) (pediatric): Secondary | ICD-10-CM | POA: Diagnosis not present

## 2023-04-10 DIAGNOSIS — I700 Atherosclerosis of aorta: Secondary | ICD-10-CM | POA: Diagnosis not present

## 2023-04-10 DIAGNOSIS — E042 Nontoxic multinodular goiter: Secondary | ICD-10-CM | POA: Diagnosis not present

## 2023-04-10 DIAGNOSIS — E782 Mixed hyperlipidemia: Secondary | ICD-10-CM | POA: Diagnosis not present

## 2023-04-10 DIAGNOSIS — R946 Abnormal results of thyroid function studies: Secondary | ICD-10-CM | POA: Diagnosis not present

## 2023-04-10 DIAGNOSIS — M25512 Pain in left shoulder: Secondary | ICD-10-CM | POA: Diagnosis not present

## 2023-04-10 DIAGNOSIS — I10 Essential (primary) hypertension: Secondary | ICD-10-CM | POA: Diagnosis not present

## 2023-05-15 ENCOUNTER — Other Ambulatory Visit (HOSPITAL_COMMUNITY): Payer: Self-pay | Admitting: Internal Medicine

## 2023-05-15 DIAGNOSIS — Z1231 Encounter for screening mammogram for malignant neoplasm of breast: Secondary | ICD-10-CM

## 2023-05-23 ENCOUNTER — Encounter (HOSPITAL_COMMUNITY): Payer: Self-pay

## 2023-05-23 ENCOUNTER — Ambulatory Visit (HOSPITAL_COMMUNITY)
Admission: RE | Admit: 2023-05-23 | Discharge: 2023-05-23 | Disposition: A | Discharge status: Home / Self Care | Payer: Medicare PPO | Source: Ambulatory Visit | Attending: Internal Medicine | Admitting: Internal Medicine

## 2023-05-23 ENCOUNTER — Encounter (HOSPITAL_COMMUNITY): Payer: Self-pay | Admitting: Internal Medicine

## 2023-05-23 DIAGNOSIS — Z1231 Encounter for screening mammogram for malignant neoplasm of breast: Secondary | ICD-10-CM | POA: Diagnosis not present

## 2023-06-18 DIAGNOSIS — H04123 Dry eye syndrome of bilateral lacrimal glands: Secondary | ICD-10-CM | POA: Diagnosis not present

## 2023-10-08 DIAGNOSIS — R7301 Impaired fasting glucose: Secondary | ICD-10-CM | POA: Diagnosis not present

## 2023-10-08 DIAGNOSIS — E782 Mixed hyperlipidemia: Secondary | ICD-10-CM | POA: Diagnosis not present

## 2023-10-08 DIAGNOSIS — R946 Abnormal results of thyroid function studies: Secondary | ICD-10-CM | POA: Diagnosis not present

## 2023-10-17 DIAGNOSIS — I700 Atherosclerosis of aorta: Secondary | ICD-10-CM | POA: Diagnosis not present

## 2023-10-17 DIAGNOSIS — Z8673 Personal history of transient ischemic attack (TIA), and cerebral infarction without residual deficits: Secondary | ICD-10-CM | POA: Diagnosis not present

## 2023-10-17 DIAGNOSIS — M25512 Pain in left shoulder: Secondary | ICD-10-CM | POA: Diagnosis not present

## 2023-10-17 DIAGNOSIS — E782 Mixed hyperlipidemia: Secondary | ICD-10-CM | POA: Diagnosis not present

## 2023-10-17 DIAGNOSIS — R7301 Impaired fasting glucose: Secondary | ICD-10-CM | POA: Diagnosis not present

## 2023-10-17 DIAGNOSIS — G4733 Obstructive sleep apnea (adult) (pediatric): Secondary | ICD-10-CM | POA: Diagnosis not present

## 2023-10-17 DIAGNOSIS — R946 Abnormal results of thyroid function studies: Secondary | ICD-10-CM | POA: Diagnosis not present

## 2023-10-17 DIAGNOSIS — I10 Essential (primary) hypertension: Secondary | ICD-10-CM | POA: Diagnosis not present

## 2023-10-17 DIAGNOSIS — K580 Irritable bowel syndrome with diarrhea: Secondary | ICD-10-CM | POA: Diagnosis not present

## 2023-11-27 DIAGNOSIS — H6123 Impacted cerumen, bilateral: Secondary | ICD-10-CM | POA: Diagnosis not present

## 2024-01-06 DIAGNOSIS — H903 Sensorineural hearing loss, bilateral: Secondary | ICD-10-CM | POA: Diagnosis not present

## 2024-06-04 DIAGNOSIS — R7301 Impaired fasting glucose: Secondary | ICD-10-CM | POA: Diagnosis not present

## 2024-06-04 DIAGNOSIS — E782 Mixed hyperlipidemia: Secondary | ICD-10-CM | POA: Diagnosis not present

## 2024-06-04 DIAGNOSIS — R946 Abnormal results of thyroid function studies: Secondary | ICD-10-CM | POA: Diagnosis not present

## 2024-06-11 DIAGNOSIS — I10 Essential (primary) hypertension: Secondary | ICD-10-CM | POA: Diagnosis not present

## 2024-06-11 DIAGNOSIS — K580 Irritable bowel syndrome with diarrhea: Secondary | ICD-10-CM | POA: Diagnosis not present

## 2024-06-11 DIAGNOSIS — G4733 Obstructive sleep apnea (adult) (pediatric): Secondary | ICD-10-CM | POA: Diagnosis not present

## 2024-06-11 DIAGNOSIS — E782 Mixed hyperlipidemia: Secondary | ICD-10-CM | POA: Diagnosis not present

## 2024-06-11 DIAGNOSIS — M25512 Pain in left shoulder: Secondary | ICD-10-CM | POA: Diagnosis not present

## 2024-06-11 DIAGNOSIS — R946 Abnormal results of thyroid function studies: Secondary | ICD-10-CM | POA: Diagnosis not present

## 2024-06-11 DIAGNOSIS — I700 Atherosclerosis of aorta: Secondary | ICD-10-CM | POA: Diagnosis not present

## 2024-06-18 DIAGNOSIS — H43393 Other vitreous opacities, bilateral: Secondary | ICD-10-CM | POA: Diagnosis not present

## 2024-10-14 ENCOUNTER — Ambulatory Visit: Admitting: Orthopedic Surgery

## 2024-10-14 ENCOUNTER — Encounter: Payer: Self-pay | Admitting: Orthopedic Surgery

## 2024-10-14 ENCOUNTER — Other Ambulatory Visit: Payer: Self-pay

## 2024-10-14 VITALS — BP 130/63 | HR 72 | Ht 66.0 in | Wt 231.0 lb

## 2024-10-14 DIAGNOSIS — G8929 Other chronic pain: Secondary | ICD-10-CM

## 2024-10-14 DIAGNOSIS — M62830 Muscle spasm of back: Secondary | ICD-10-CM

## 2024-10-14 DIAGNOSIS — M19012 Primary osteoarthritis, left shoulder: Secondary | ICD-10-CM | POA: Diagnosis not present

## 2024-10-14 DIAGNOSIS — M542 Cervicalgia: Secondary | ICD-10-CM | POA: Diagnosis not present

## 2024-10-14 MED ORDER — METHOCARBAMOL 500 MG PO TABS: 500.0000 mg | ORAL_TABLET | Freq: Three times a day (TID) | ORAL | 0 refills | Status: AC | PRN

## 2024-10-14 NOTE — Progress Notes (Signed)
 New Patient Visit  Assessment & Plan Muscle spasm of neck and upper back Chronic muscle spasm in neck and upper back, likely from awkward sleeping position. Pain radiates across shoulder and down arm. X-rays show mild wear and tear. Pain is muscular with spasm and tightness limiting motion. - Prescribed muscle relaxer as needed, cautioning about drowsiness. - Provided exercises to improve motion and relax muscles. - Recommended heating pad and massage for muscle tightness. - Advised stretching exercises with hot water  in shower. - Suggested physical therapy if exercises are ineffective.  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 X-rays show severe arthritis with bone-on-bone contact. Pain is primarily muscular, not joint-related. - Continue Tylenol  as needed, not exceeding 3000 mg/day. - Monitor for increased pain or functional impairment, consider injection therapy if needed.   Follow-up: Return if symptoms worsen or fail to improve.  Subjective:  Chief Complaint  Patient presents with   Neck Pain    Pt states she slept wrong on 09/08/24 and when she woke up she had a catch in the shoulder and pain going down the arm. Had pain that was excruciating for 2-3 wks but has subsided. Has ben using heat, ice, and Tylenol .      Discussed the use of AI scribe software for clinical note transcription with the patient, who gave verbal consent to proceed.  History of Present Illness Heidi Rhodes is a 73 year old female who presents with persistent neck and shoulder pain. She previously saw Dr. Brenna for evaluation of her neck and shoulder pain.  On October 28th, she awoke in a recliner with sudden severe neck pain, requiring her to turn her whole body to look around. Pain radiated across her shoulder and down her arm and was most severe for the first three weeks, making it hard to lie down at  night.  Since then she has had constant pain, now mainly at the top of her shoulder, across her upper back, and sometimes into the left shoulder. Neck motion remains restricted and she often rotates her whole body to look around. The weight of her pocketbook worsens pain. Cold, heat, and nighttime extra strength Tylenol  provide partial relief and help her sleep.  She reports no numbness or tingling in her hands. She is right-handed and notes some restriction in shoulder motion. She had a mild stroke about four years ago with only occasional slurred speech when tired and does not feel her current neck and shoulder symptoms are similar.  She takes two 500 mg extra strength Tylenol  tablets at night for pain and sleep in addition to blood pressure and cholesterol medication, aspirin, and vitamins.  She is the primary caregiver for her husband who is on dialysis, which may limit her ability to rest or modify activity. She also crochets and knits, which she feels helps her finger arthritis.    Review of Systems: No fevers or chills No numbness or tingling No chest pain No shortness of breath No bowel or bladder dysfunction No GI distress No headaches   Medical History:  Past Medical History:  Diagnosis Date   Anxiety    about being put to sleep for  surgery   Arthritis    Gallbladder disorder    High cholesterol    HTN (hypertension)    Sleep apnea    Stroke [facility])     Past Surgical History:  Procedure Laterality Date   APPENDECTOMY  CATARACT EXTRACTION W/PHACO Left 06/02/2018   Procedure: CATARACT EXTRACTION PHACO AND INTRAOCULAR LENS PLACEMENT LEFT EYE;  Surgeon: Perley Hamilton, MD;  Location: [facility];  Service: Ophthalmology;  Laterality: Left;  CDE: 8.40   CATARACT EXTRACTION W/PHACO Right 06/23/2018   Procedure: CATARACT EXTRACTION PHACO AND INTRAOCULAR LENS PLACEMENT (IOC);  Surgeon: Perley Hamilton, MD;  Location: [facility];  Service: Ophthalmology;  Laterality: Right;  CDE: 9.94    ceasarean section     CHOLECYSTECTOMY  06/16/2012   Procedure: LAPAROSCOPIC CHOLECYSTECTOMY;  Surgeon: Elsie GORMAN Holland, MD;  Location: [facility];  Service: General;  Laterality: N/A;   COLONOSCOPY  02/13/2012   Procedure: COLONOSCOPY;  Surgeon: Claudis RAYMOND Rivet, MD;  Location: [facility];  Service: Endoscopy;  Laterality: N/A;  930 possibly earlier for antibiotics   COLONOSCOPY WITH PROPOFOL  N/A 03/02/2022   Procedure: COLONOSCOPY WITH PROPOFOL ;  Surgeon: Eartha Angelia Sieving, MD;  Location: [facility];  Service: Gastroenterology;  Laterality: N/A;  915   Left knee replacement     2012   POLYPECTOMY  03/02/2022   Procedure: POLYPECTOMY INTESTINAL;  Surgeon: Eartha Angelia Sieving, MD;  Location: [facility];  Service: Gastroenterology;;   TOTAL KNEE ARTHROPLASTY  04/18/2012   Procedure: TOTAL KNEE ARTHROPLASTY;  Surgeon: LELON JONETTA Shari Mickey., MD;  Location: [facility];  Service: Orthopedics;  Laterality: Right;  with revison tibia    Family History  Problem Relation Age of Onset   Heart attack Father    Other Brother        car accident   Anesthesia problems Neg Hx    Social History   Tobacco Use   Smoking status: Never   Smokeless tobacco: Never  Vaping Use   Vaping status: Never Used  Substance Use Topics   Alcohol use: No   Drug use: No    No Known Allergies  Current Meds  Medication Sig   methocarbamol  (ROBAXIN ) 500 MG tablet Take 1 tablet (500 mg total) by mouth every 8 (eight) hours as needed for muscle spasms.    Objective: BP 130/63   Pulse 72   Ht 5' 6 (1.676 m)   Wt 231 lb (104.8 kg)   BMI 37.28 kg/m   Physical Exam:    General: Elderly female., Alert and oriented., and No acute distress. Gait: Normal gait.  Physical Exam  Tenderness to palpation within the trapezius bilaterally.  Restricted range of motion in the neck.  Sensation is intact distally.  Slightly restricted range of motion of the left shoulder.  140 degrees of forward flexion.  25  degrees of external rotation at her side.  Sensation intact in the axillary nerve distribution.  Right shoulder is 160 degrees of forward flexion and 45 degrees of external rotation.   IMAGING: I personally ordered and reviewed the following images   Cervical spine x-rays were obtained in clinic today.  No acute injuries are noted.  Slight loss of lordosis.  Mild loss of disc height.  Some small anterior based osteophytes.  No anterolisthesis.  No bony lesions.  Impression: Negative cervical spine x-rays, with mild spondylosis   X-rays of the left shoulder were obtained clinic today.  No acute injuries are noted.  Complete loss of glenohumeral joint space.  There is a large inferior osteophyte off the humeral head.  No evidence of proximal humeral migration.  There is subchondral cyst.  There is subchondral sclerosis.  Mild flattening of the humeral head.  Impression: Severe left glenohumeral joint arthritis  New Medications:  Meds ordered this encounter  Medications   methocarbamol  (ROBAXIN ) 500 MG tablet    Sig: Take 1 tablet (500 mg total) by mouth every 8 (eight) hours as needed for muscle spasms.    Dispense:  30 tablet    Refill:  0      Portions of this note were completed via Scientist, clinical (histocompatibility and immunogenetics).  Oneil DELENA Horde, MD  10/14/2024 9:23 AM

## 2024-10-14 NOTE — Patient Instructions (Signed)
 Cervical Strain and Sprain Rehab You have pain and stiffness in your neck.  The muscles around your neck are irritated.  Recommend using heat (heating pad, or hot water in the shower) to warm up the affected muscles.  Then proceed with stretching and strengthening.  Do not stretch until it hurts, but you should feel a pull.  With each exercise, you should be able to stretch a little bit further.  Attempting these exercises daily, or on a regular basis, can improve your symptoms over time.  Do not expect immediate, sustained improvement.  But, it will make your symptoms better over time.   Ask your health care provider which exercises are safe for you. Do exercises exactly as told by your health care provider and adjust them as directed. It is normal to feel mild stretching, pulling, tightness, or discomfort as you do these exercises. Stop right away if you feel sudden pain or your pain gets worse. Do not begin these exercises until told by your health care provider. Stretching and range-of-motion exercises Cervical side bending  Using good posture, sit on a stable chair or stand up. Without moving your shoulders, slowly tilt your left / right ear to your shoulder until you feel a stretch in the opposite side neck muscles. You should be looking straight ahead. Hold for 10 seconds. Repeat with the other side of your neck. Repeat 10 times. Complete this exercise 1-2 times a day. Cervical rotation  Using good posture, sit on a stable chair or stand up. Slowly turn your head to the side as if you are looking over your left / right shoulder. Keep your eyes level with the ground. Stop when you feel a stretch along the side and the back of your neck. Hold for 10 seconds. Repeat this by turning to your other side. Repeat 10 times. Complete this exercise 1-2 times a day. Thoracic extension and pectoral stretch Roll a towel or a small blanket so it is about 4 inches (10 cm) in diameter. Lie down on  your back on a firm surface. Put the towel lengthwise, under your spine in the middle of your back. It should not be under your shoulder blades. The towel should line up with your spine from your middle back to your lower back. Put your hands behind your head and let your elbows fall out to your sides. Hold for 10 seconds. Repeat 10 times. Complete this exercise 1-2 times a day. Strengthening exercises Isometric upper cervical flexion Lie on your back with a thin pillow behind your head and a small rolled-up towel under your neck. Gently tuck your chin toward your chest and nod your head down to look toward your feet. Do not lift your head off the pillow. Hold for 10 seconds. Release the tension slowly. Relax your neck muscles completely before you repeat this exercise. Repeat 10 times. Complete this exercise 1-2 times a day. Isometric cervical extension  Stand about 6 inches (15 cm) away from a wall, with your back facing the wall. Place a soft object, about 6-8 inches (15-20 cm) in diameter, between the back of your head and the wall. A soft object could be a small pillow, a ball, or a folded towel. Gently tilt your head back and press into the soft object. Keep your jaw and forehead relaxed. Hold for 10 seconds. Release the tension slowly. Relax your neck muscles completely before you repeat this exercise. Repeat 10 times. Complete this exercise 1-2 times a day. Posture  and body mechanics Body mechanics refers to the movements and positions of your body while you do your daily activities. Posture is part of body mechanics. Good posture and healthy body mechanics can help to relieve stress in your body's tissues and joints. Good posture means that your spine is in its natural S-curve position (your spine is neutral), your shoulders are pulled back slightly, and your head is not tipped forward. The following are general guidelines for applying improved posture and body mechanics to your  everyday activities. Sitting  When sitting, keep your spine neutral and keep your feet flat on the floor. Use a footrest, if necessary, and keep your thighs parallel to the floor. Avoid rounding your shoulders, and avoid tilting your head forward. When working at a desk or a computer, keep your desk at a height where your hands are slightly lower than your elbows. Slide your chair under your desk so you are close enough to maintain good posture. When working at a computer, place your monitor at a height where you are looking straight ahead and you do not have to tilt your head forward or downward to look at the screen. Standing  When standing, keep your spine neutral and keep your feet about hip-width apart. Keep a slight bend in your knees. Your ears, shoulders, and hips should line up. When you do a task in which you stand in one place for a long time, place one foot up on a stable object that is 2-4 inches (5-10 cm) high, such as a footstool. This helps keep your spine neutral. Resting When lying down and resting, avoid positions that are most painful for you. Try to support your neck in a neutral position. You can use a contour pillow or a small rolled-up towel. Your pillow should support your neck but not push on it. This information is not intended to replace advice given to you by your health care provider. Make sure you discuss any questions you have with your health care provider. Document Revised: 02/18/2019 Document Reviewed: 07/30/2018 Elsevier Patient Education  2022 [employer].
